# Patient Record
Sex: Female | Born: 1965 | Race: White | Hispanic: No | Smoking: Current every day smoker
Health system: Southern US, Community
[De-identification: ages and names within clinical notes are randomized; demographics above are authoritative.]

## PROBLEM LIST (undated history)

## (undated) DIAGNOSIS — I10 Essential (primary) hypertension: Secondary | ICD-10-CM

## (undated) DIAGNOSIS — F32A Depression, unspecified: Secondary | ICD-10-CM

## (undated) DIAGNOSIS — F419 Anxiety disorder, unspecified: Secondary | ICD-10-CM

## (undated) HISTORY — DX: Essential (primary) hypertension: I10

## (undated) HISTORY — DX: Depression, unspecified: F32.A

## (undated) HISTORY — PX: ABDOMINAL HYSTERECTOMY: SHX81

## (undated) HISTORY — PX: CHOLECYSTECTOMY: SHX55

## (undated) HISTORY — DX: Anxiety disorder, unspecified: F41.9

---

## 2004-09-30 ENCOUNTER — Other Ambulatory Visit: Payer: Self-pay

## 2004-09-30 ENCOUNTER — Emergency Department: Payer: Self-pay | Admitting: Emergency Medicine

## 2005-08-05 ENCOUNTER — Emergency Department: Payer: Self-pay | Admitting: Emergency Medicine

## 2007-04-29 ENCOUNTER — Emergency Department: Payer: Self-pay | Admitting: Emergency Medicine

## 2007-09-27 ENCOUNTER — Emergency Department: Payer: Self-pay | Admitting: Emergency Medicine

## 2007-11-12 ENCOUNTER — Ambulatory Visit: Payer: Self-pay | Admitting: Vascular Surgery

## 2009-12-24 ENCOUNTER — Emergency Department: Payer: Self-pay | Admitting: Emergency Medicine

## 2010-01-08 ENCOUNTER — Emergency Department: Payer: Self-pay | Admitting: Unknown Physician Specialty

## 2010-03-18 ENCOUNTER — Emergency Department: Payer: Self-pay | Admitting: Unknown Physician Specialty

## 2010-03-19 ENCOUNTER — Emergency Department: Payer: Self-pay | Admitting: Emergency Medicine

## 2010-03-30 ENCOUNTER — Emergency Department: Payer: Self-pay | Admitting: Emergency Medicine

## 2010-04-16 ENCOUNTER — Emergency Department: Payer: Self-pay | Admitting: Emergency Medicine

## 2010-04-27 ENCOUNTER — Emergency Department: Payer: Self-pay | Admitting: Emergency Medicine

## 2010-05-06 ENCOUNTER — Emergency Department: Payer: Self-pay | Admitting: Emergency Medicine

## 2010-05-26 ENCOUNTER — Emergency Department: Payer: Self-pay | Admitting: Emergency Medicine

## 2010-10-08 ENCOUNTER — Emergency Department: Payer: Self-pay | Admitting: Emergency Medicine

## 2010-11-12 ENCOUNTER — Emergency Department: Payer: Self-pay | Admitting: Emergency Medicine

## 2010-12-19 ENCOUNTER — Emergency Department: Payer: Self-pay | Admitting: Emergency Medicine

## 2011-02-18 ENCOUNTER — Emergency Department: Payer: Self-pay | Admitting: Emergency Medicine

## 2011-05-13 ENCOUNTER — Emergency Department: Payer: Self-pay | Admitting: Internal Medicine

## 2011-05-15 ENCOUNTER — Emergency Department: Payer: Self-pay | Admitting: Emergency Medicine

## 2011-07-11 ENCOUNTER — Emergency Department: Payer: Self-pay | Admitting: Emergency Medicine

## 2011-07-15 ENCOUNTER — Emergency Department: Payer: Self-pay | Admitting: Unknown Physician Specialty

## 2012-02-13 ENCOUNTER — Emergency Department: Payer: Self-pay | Admitting: Emergency Medicine

## 2012-02-13 LAB — BASIC METABOLIC PANEL
Anion Gap: 8 (ref 7–16)
BUN: 6 mg/dL — ABNORMAL LOW (ref 7–18)
Calcium, Total: 8.4 mg/dL — ABNORMAL LOW (ref 8.5–10.1)
Chloride: 111 mmol/L — ABNORMAL HIGH (ref 98–107)
Co2: 23 mmol/L (ref 21–32)
EGFR (Non-African Amer.): >60
Glucose: 105 mg/dL — ABNORMAL HIGH (ref 65–99)
Potassium: 3.7 mmol/L (ref 3.5–5.1)

## 2012-02-13 LAB — CBC
MCHC: 33.6 g/dL (ref 32.0–36.0)
Platelet: 353 10*3/uL (ref 150–440)
RBC: 4.28 10*6/uL (ref 3.80–5.20)
RDW: 15.4 % — ABNORMAL HIGH (ref 11.5–14.5)

## 2012-10-31 ENCOUNTER — Emergency Department: Payer: Self-pay | Admitting: Emergency Medicine

## 2012-12-22 ENCOUNTER — Emergency Department: Payer: Self-pay | Admitting: Unknown Physician Specialty

## 2016-01-24 ENCOUNTER — Emergency Department
Admission: EM | Admit: 2016-01-24 | Discharge: 2016-01-25 | Disposition: A | Discharge status: Home / Self Care | Payer: Self-pay | Attending: Emergency Medicine | Admitting: Emergency Medicine

## 2016-01-24 ENCOUNTER — Encounter: Payer: Self-pay | Admitting: *Deleted

## 2016-01-24 ENCOUNTER — Emergency Department: Payer: Self-pay

## 2016-01-24 DIAGNOSIS — R1013 Epigastric pain: Secondary | ICD-10-CM | POA: Insufficient documentation

## 2016-01-24 DIAGNOSIS — F17210 Nicotine dependence, cigarettes, uncomplicated: Secondary | ICD-10-CM | POA: Insufficient documentation

## 2016-01-24 LAB — COMPREHENSIVE METABOLIC PANEL
ALT: 18 U/L (ref 14–54)
AST: 18 U/L (ref 15–41)
Albumin: 4.3 g/dL (ref 3.5–5.0)
Alkaline Phosphatase: 92 U/L (ref 38–126)
Anion gap: 8 (ref 5–15)
BUN: 8 mg/dL (ref 6–20)
CHLORIDE: 103 mmol/L (ref 101–111)
CO2: 27 mmol/L (ref 22–32)
Calcium: 9.2 mg/dL (ref 8.9–10.3)
Creatinine, Ser: 0.58 mg/dL (ref 0.44–1.00)
GFR calc Af Amer: >60 mL/min (ref >60)
GFR calc non Af Amer: >60 mL/min (ref >60)
GLUCOSE: 108 mg/dL — AB (ref 65–99)
POTASSIUM: 3.7 mmol/L (ref 3.5–5.1)
SODIUM: 138 mmol/L (ref 135–145)
Total Bilirubin: 0.5 mg/dL (ref 0.3–1.2)
Total Protein: 7.6 g/dL (ref 6.5–8.1)

## 2016-01-24 LAB — URINALYSIS COMPLETE WITH MICROSCOPIC (ARMC ONLY)
BACTERIA UA: NONE SEEN
Bilirubin Urine: NEGATIVE
Glucose, UA: NEGATIVE mg/dL
KETONES UR: NEGATIVE mg/dL
Nitrite: NEGATIVE
Protein, ur: NEGATIVE mg/dL
RBC / HPF: NONE SEEN RBC/hpf (ref 0–5)
Specific Gravity, Urine: 1.008 (ref 1.005–1.030)
pH: 6 (ref 5.0–8.0)

## 2016-01-24 LAB — CBC WITH DIFFERENTIAL/PLATELET
BASOS ABS: 0.1 10*3/uL (ref 0–0.1)
BASOS PCT: 1 %
Eosinophils Absolute: 0.2 10*3/uL (ref 0–0.7)
Eosinophils Relative: 2 %
HEMATOCRIT: 35.6 % (ref 35.0–47.0)
Hemoglobin: 12.4 g/dL (ref 12.0–16.0)
Lymphocytes Relative: 22 %
Lymphs Abs: 2.1 10*3/uL (ref 1.0–3.6)
MCH: 31.2 pg (ref 26.0–34.0)
MCHC: 34.9 g/dL (ref 32.0–36.0)
MCV: 89.4 fL (ref 80.0–100.0)
MONO ABS: 0.9 10*3/uL (ref 0.2–0.9)
Monocytes Relative: 10 %
NEUTROS ABS: 6.1 10*3/uL (ref 1.4–6.5)
Neutrophils Relative %: 65 %
PLATELETS: 291 10*3/uL (ref 150–440)
RBC: 3.98 MIL/uL (ref 3.80–5.20)
RDW: 13.5 % (ref 11.5–14.5)
WBC: 9.4 10*3/uL (ref 3.6–11.0)

## 2016-01-24 LAB — LIPASE, BLOOD: LIPASE: 25 U/L (ref 11–51)

## 2016-01-24 MED ORDER — GI COCKTAIL ~~LOC~~
30.0000 mL | Freq: Once | ORAL | Status: AC
  Administered 2016-01-24: 30 mL via ORAL
  Filled 2016-01-24: qty 30

## 2016-01-24 MED ORDER — SODIUM CHLORIDE 0.9 % IV BOLUS (SEPSIS)
1000.0000 mL | Freq: Once | INTRAVENOUS | Status: AC
  Administered 2016-01-24: 1000 mL via INTRAVENOUS

## 2016-01-24 MED ORDER — FAMOTIDINE 20 MG PO TABS: 20.0000 mg | ORAL_TABLET | Freq: Two times a day (BID) | ORAL | Status: DC

## 2016-01-24 MED ORDER — FAMOTIDINE IN NACL 20-0.9 MG/50ML-% IV SOLN
20.0000 mg | Freq: Once | INTRAVENOUS | Status: AC
  Administered 2016-01-24: 20 mg via INTRAVENOUS
  Filled 2016-01-24: qty 50

## 2016-01-24 MED ORDER — BARIUM SULFATE 2.1 % PO SUSP
450.0000 mL | ORAL | Status: AC
  Administered 2016-01-24 (×2): 450 mL via ORAL

## 2016-01-24 MED ORDER — GI COCKTAIL ~~LOC~~
ORAL | Status: AC
  Filled 2016-01-24: qty 30

## 2016-01-24 MED ORDER — SUCRALFATE 1 G PO TABS: 1.0000 g | ORAL_TABLET | Freq: Four times a day (QID) | ORAL | Status: DC

## 2016-01-24 NOTE — ED Notes (Addendum)
Pt to ED with left mid quad abd pain and lower back pain. Pt states diarrhea, denies vomiting. Vitals stable, NAD noted. Pt denies any associated urinary s/s.

## 2016-01-24 NOTE — Discharge Instructions (Signed)

## 2016-01-24 NOTE — ED Notes (Signed)
Dr Alphonzo LemmingsMcShane requested that Mary Hall void and then have bladder scan performed - prior to my entering the room Mary Hall voided "large amount" in the toilet - bladder scanner reveals 61cc of urine

## 2016-01-24 NOTE — ED Provider Notes (Addendum)
[facility] Emergency Department Provider Note  ____________________________________________   I have reviewed the triage vital signs and the nursing notes.   HISTORY  Chief Complaint Abdominal Pain    HPI Mary Hall is a 50 y.o. female presents today with epigastric abdominal pain. There is no lower abdominal pain. She states that she has had no loose stools today. She's not had any vomiting but had some nausea. She denies melena bright red blood per rectum. She states that she has had pain off and on her stomach for some time. She has no chest pain or shortness of breath. The pain is sharp, and nonradiating. She has not had any dysuria or urinary frequency. Has had her gallbladder out. States that she had her gallbladder out she has 3 very particular about what she eats because it can cause her to have discomfort like this. Has had this several times in the past.     History reviewed. No pertinent past medical history.  There are no active problems to display for this patient.   Past Surgical History  Procedure Laterality Date  . Abdominal hysterectomy    . Cholecystectomy      No current outpatient prescriptions on file.  Allergies Contrast media; Morphine and related; and Motrin  History reviewed. No pertinent family history.  Social History Social History  Substance Use Topics  . Smoking status: Current Every Day Smoker -- 1.00 packs/day    Types: Cigarettes  . Smokeless tobacco: None  . Alcohol Use: No    Review of Systems Constitutional: No fever/chills Eyes: No visual changes. ENT: No sore throat. No stiff neck no neck pain Cardiovascular: Denies chest pain. Respiratory: Denies shortness of breath. Gastrointestinal:   no vomiting.  No loose stools .  No constipation. Genitourinary: Negative for dysuria. Musculoskeletal: Negative lower extremity swelling Skin: Negative for rash. Neurological: Negative for headaches, focal  weakness or numbness. 10-point ROS otherwise negative.  ____________________________________________   PHYSICAL EXAM:  VITAL SIGNS: ED Triage Vitals  Enc Vitals Group     BP 01/24/16 1920 165/93 mmHg     Pulse Rate 01/24/16 1920 109     Resp 01/24/16 1920 18     Temp 01/24/16 1920 98.5 F (36.9 C)     Temp Source 01/24/16 1920 Oral     SpO2 01/24/16 1920 99 %     Weight 01/24/16 1920 140 lb (63.504 kg)     Height 01/24/16 1920 5\' 2"  (1.575 m)     Head Cir --      Peak Flow --      Pain Score 01/24/16 1921 10     Pain Loc --      Pain Edu? --      Excl. in GC? --     Constitutional: Alert and oriented. Well appearing and in no acute distress. Eyes: Conjunctivae are normal. PERRL. EOMI. Head: Atraumatic. Nose: No congestion/rhinnorhea. Mouth/Throat: Mucous membranes are moist.  Oropharynx non-erythematous. Neck: No stridor.   Nontender with no meningismus Cardiovascular: Normal rate, regular rhythm. Grossly normal heart sounds.  Good peripheral circulation. Respiratory: Normal respiratory effort.  No retractions. Lungs CTAB. Abdominal: Soft and tender to palpation in the epigastric region. No distention. No guarding no rebound Back:  There is no focal tenderness or step off there is no midline tenderness there are no lesions noted. there is no CVA tenderness  Musculoskeletal: No lower extremity tenderness. No joint effusions, no DVT signs strong distal pulses no edema Neurologic:  Normal  speech and language. No gross focal neurologic deficits are appreciated.  Skin:  Skin is warm, dry and intact. No rash noted. Psychiatric: Mood and affect are normal. Speech and behavior are normal.  ____________________________________________   LABS (all labs ordered are listed, but only abnormal results are displayed)  Labs Reviewed  LIPASE, BLOOD  COMPREHENSIVE METABOLIC PANEL  CBC  URINALYSIS COMPLETEWITH MICROSCOPIC ([facility] ONLY)    ____________________________________________  EKG  I personally interpreted any EKGs ordered by me or triage  ____________________________________________  RADIOLOGY  I reviewed any imaging ordered by me or triage that were performed during my shift and, if possible, patient and/or family made aware of any abnormal findings. ____________________________________________   PROCEDURES  Procedure(s) performed: None  Critical Care performed: None  ____________________________________________   INITIAL IMPRESSION / ASSESSMENT AND PLAN / ED COURSE  Pertinent labs & imaging results that were available during my care of the patient were reviewed by me and considered in my medical decision making (see chart for details).  Patient with reproducible epigastric abdominal pain which is been there off and on for some time. Her abdomen is actually not surgical at this time. Did offer a GI cocktail to think that will help her symptoms, but she refused stating that she took something like that earlier today. We will give her antiacids. We'll check blood work and reassess. Do not think this represents PE ACS dissection myocarditis endocarditis pericarditis or pneumonia or pneumothorax or any other intra-thoracic pathology is very reproducible food related epigastric abdominal pain at this time. Most likely this represents gastritis or gastric ulcer disease, very low suspicion for perforation, pancreatitis is certainly possible as well.  ----------------------------------------- 10:18 PM on 01/24/2016 -----------------------------------------  Patient has a morphine allergy and cannot take narcotics she states. I am reassured by her blood work and findings. Patient does have continued epigastric abdominal pain. She refused a GI cocktail which I think would help her. The Pepcid has decreased her pain however. Patient is requesting a CT scan for further evaluation of the discomfort she is  experiencing. I have low suspicion that we'll see a perforation but I certainly can't rule it out without imaging and we will obtain a quick CT scan to make sure that there is no other acute pathology present. If that is negative, we'll discharge the patient home  ----------------------------------------- 10:19 PM on 01/24/2016 -----------------------------------------  Patient is able to take by mouth and she does agree to a GI cocktail.     ____________________________________________   FINAL CLINICAL IMPRESSION(S) / ED DIAGNOSES  Final diagnoses:  None      This chart was dictated using voice recognition software.  Despite best efforts to proofread,  errors can occur which can change meaning.     Jeanmarie Plant, MD 01/24/16 2013  Jeanmarie Plant, MD 01/24/16 3086  Jeanmarie Plant, MD 01/24/16 2220

## 2016-03-03 ENCOUNTER — Encounter: Payer: Self-pay | Admitting: Emergency Medicine

## 2016-03-03 ENCOUNTER — Emergency Department: Payer: Self-pay

## 2016-03-03 ENCOUNTER — Emergency Department
Admission: EM | Admit: 2016-03-03 | Discharge: 2016-03-03 | Disposition: A | Discharge status: Home / Self Care | Payer: Self-pay | Attending: Emergency Medicine | Admitting: Emergency Medicine

## 2016-03-03 DIAGNOSIS — K219 Gastro-esophageal reflux disease without esophagitis: Secondary | ICD-10-CM | POA: Insufficient documentation

## 2016-03-03 DIAGNOSIS — K529 Noninfective gastroenteritis and colitis, unspecified: Secondary | ICD-10-CM | POA: Insufficient documentation

## 2016-03-03 DIAGNOSIS — F17210 Nicotine dependence, cigarettes, uncomplicated: Secondary | ICD-10-CM | POA: Insufficient documentation

## 2016-03-03 LAB — CBC
HEMATOCRIT: 40.9 % (ref 35.0–47.0)
HEMOGLOBIN: 13.8 g/dL (ref 12.0–16.0)
MCH: 30.7 pg (ref 26.0–34.0)
MCHC: 33.9 g/dL (ref 32.0–36.0)
MCV: 90.6 fL (ref 80.0–100.0)
Platelets: 316 10*3/uL (ref 150–440)
RBC: 4.52 MIL/uL (ref 3.80–5.20)
RDW: 13.1 % (ref 11.5–14.5)
WBC: 10.4 10*3/uL (ref 3.6–11.0)

## 2016-03-03 LAB — COMPREHENSIVE METABOLIC PANEL
ALBUMIN: 4.5 g/dL (ref 3.5–5.0)
ALT: 20 U/L (ref 14–54)
ANION GAP: 9 (ref 5–15)
AST: 25 U/L (ref 15–41)
Alkaline Phosphatase: 96 U/L (ref 38–126)
BUN: 11 mg/dL (ref 6–20)
CO2: 24 mmol/L (ref 22–32)
Calcium: 9.3 mg/dL (ref 8.9–10.3)
Chloride: 106 mmol/L (ref 101–111)
Creatinine, Ser: 0.62 mg/dL (ref 0.44–1.00)
GFR calc Af Amer: >60 mL/min (ref >60)
GFR calc non Af Amer: >60 mL/min (ref >60)
GLUCOSE: 102 mg/dL — AB (ref 65–99)
POTASSIUM: 3.9 mmol/L (ref 3.5–5.1)
SODIUM: 139 mmol/L (ref 135–145)
Total Bilirubin: 0.4 mg/dL (ref 0.3–1.2)
Total Protein: 7.6 g/dL (ref 6.5–8.1)

## 2016-03-03 LAB — TROPONIN I: Troponin I: <0.03 ng/mL (ref <0.031)

## 2016-03-03 LAB — LIPASE, BLOOD: Lipase: 37 U/L (ref 11–51)

## 2016-03-03 MED ORDER — GI COCKTAIL ~~LOC~~
30.0000 mL | Freq: Once | ORAL | Status: AC
  Administered 2016-03-03: 30 mL via ORAL
  Filled 2016-03-03: qty 30

## 2016-03-03 MED ORDER — ONDANSETRON HCL 4 MG PO TABS: 4.0000 mg | ORAL_TABLET | Freq: Every day | ORAL | Status: DC | PRN

## 2016-03-03 MED ORDER — ONDANSETRON HCL 4 MG/2ML IJ SOLN
4.0000 mg | Freq: Once | INTRAMUSCULAR | Status: AC
  Administered 2016-03-03: 4 mg via INTRAVENOUS
  Filled 2016-03-03: qty 2

## 2016-03-03 MED ORDER — FAMOTIDINE 20 MG PO TABS: 20.0000 mg | ORAL_TABLET | Freq: Two times a day (BID) | ORAL | Status: DC

## 2016-03-03 MED ORDER — KETOROLAC TROMETHAMINE 30 MG/ML IJ SOLN
30.0000 mg | Freq: Once | INTRAMUSCULAR | Status: AC
  Administered 2016-03-03: 30 mg via INTRAVENOUS
  Filled 2016-03-03: qty 1

## 2016-03-03 MED ORDER — SODIUM CHLORIDE 0.9 % IV SOLN
Freq: Once | INTRAVENOUS | Status: AC
  Administered 2016-03-03: 20:00:00 via INTRAVENOUS

## 2016-03-03 MED ORDER — FENTANYL CITRATE (PF) 100 MCG/2ML IJ SOLN
25.0000 ug | Freq: Once | INTRAMUSCULAR | Status: AC
  Administered 2016-03-03: 25 ug via INTRAVENOUS
  Filled 2016-03-03: qty 2

## 2016-03-03 MED ORDER — FAMOTIDINE 20 MG PO TABS
20.0000 mg | ORAL_TABLET | Freq: Once | ORAL | Status: AC
  Administered 2016-03-03: 20 mg via ORAL
  Filled 2016-03-03: qty 1

## 2016-03-03 NOTE — ED Notes (Signed)
Discussed discharge instructions, prescriptions, and follow-up care with patient. No questions or concerns at this time. Pt stable at discharge.  

## 2016-03-03 NOTE — Discharge Instructions (Signed)
Gastroesophageal Reflux Disease, Adult Normally, food travels down the esophagus and stays in the stomach to be digested. However, when a person has gastroesophageal reflux disease (GERD), food and stomach acid move back up into the esophagus. When this happens, the esophagus becomes sore and inflamed. Over time, GERD can create small holes (ulcers) in the lining of the esophagus.  CAUSES This condition is caused by a problem with the muscle between the esophagus and the stomach (lower esophageal sphincter, or LES). Normally, the LES muscle closes after food passes through the esophagus to the stomach. When the LES is weakened or abnormal, it does not close properly, and that allows food and stomach acid to go back up into the esophagus. The LES can be weakened by certain dietary substances, medicines, and medical conditions, including:  Tobacco use.  Pregnancy.  Having a hiatal hernia.  Heavy alcohol use.  Certain foods and beverages, such as coffee, chocolate, onions, and peppermint. RISK FACTORS This condition is more likely to develop in:  People who have an increased body weight.  People who have connective tissue disorders.  People who use NSAID medicines. SYMPTOMS Symptoms of this condition include:  Heartburn.  Difficult or painful swallowing.  The feeling of having a lump in the throat.  Abitter taste in the mouth.  Bad breath.  Having a large amount of saliva.  Having an upset or bloated stomach.  Belching.  Chest pain.  Shortness of breath or wheezing.  Ongoing (chronic) cough or a night-time cough.  Wearing away of tooth enamel.  Weight loss. Different conditions can cause chest pain. Make sure to see your health care provider if you experience chest pain. DIAGNOSIS Your health care provider will take a medical history and perform a physical exam. To determine if you have mild or severe GERD, your health care provider may also monitor how you respond  to treatment. You may also have other tests, including:  An endoscopy toexamine your stomach and esophagus with a small camera.  A test thatmeasures the acidity level in your esophagus.  A test thatmeasures how much pressure is on your esophagus.  A barium swallow or modified barium swallow to show the shape, size, and functioning of your esophagus. TREATMENT The goal of treatment is to help relieve your symptoms and to prevent complications. Treatment for this condition may vary depending on how severe your symptoms are. Your health care provider may recommend:  Changes to your diet.  Medicine.  Surgery. HOME CARE INSTRUCTIONS Diet  Follow a diet as recommended by your health care provider. This may involve avoiding foods and drinks such as:  Coffee and tea (with or without caffeine).  Drinks that containalcohol.  Energy drinks and sports drinks.  Carbonated drinks or sodas.  Chocolate and cocoa.  Peppermint and mint flavorings.  Garlic and onions.  Horseradish.  Spicy and acidic foods, including peppers, chili powder, curry powder, vinegar, hot sauces, and barbecue sauce.  Citrus fruit juices and citrus fruits, such as oranges, lemons, and limes.  Tomato-based foods, such as red sauce, chili, salsa, and pizza with red sauce.  Fried and fatty foods, such as donuts, french fries, potato chips, and high-fat dressings.  High-fat meats, such as hot dogs and fatty cuts of red and white meats, such as rib eye steak, sausage, ham, and bacon.  High-fat dairy items, such as whole milk, butter, and cream cheese.  Eat small, frequent meals instead of large meals.  Avoid drinking large amounts of liquid with your  meals.  Avoid eating meals during the 2-3 hours before bedtime.  Avoid lying down right after you eat.  Do not exercise right after you eat. General Instructions  Pay attention to any changes in your symptoms.  Take over-the-counter and prescription  medicines only as told by your health care provider. Do not take aspirin, ibuprofen, or other NSAIDs unless your health care provider told you to do so.  Do not use any tobacco products, including cigarettes, chewing tobacco, and e-cigarettes. If you need help quitting, ask your health care provider.  Wear loose-fitting clothing. Do not wear anything tight around your waist that causes pressure on your abdomen.  Raise (elevate) the head of your bed 6 inches (15cm).  Try to reduce your stress, such as with yoga or meditation. If you need help reducing stress, ask your health care provider.  If you are overweight, reduce your weight to an amount that is healthy for you. Ask your health care provider for guidance about a safe weight loss goal.  Keep all follow-up visits as told by your health care provider. This is important. SEEK MEDICAL CARE IF:  You have new symptoms.  You have unexplained weight loss.  You have difficulty swallowing, or it hurts to swallow.  You have wheezing or a persistent cough.  Your symptoms do not improve with treatment.  You have a hoarse voice. SEEK IMMEDIATE MEDICAL CARE IF:  You have pain in your arms, neck, jaw, teeth, or back.  You feel sweaty, dizzy, or light-headed.  You have chest pain or shortness of breath.  You vomit and your vomit looks like blood or coffee grounds.  You faint.  Your stool is bloody or black.  You cannot swallow, drink, or eat.   This information is not intended to replace advice given to you by your health care provider. Make sure you discuss any questions you have with your health care provider.   Document Released: 06/27/2005 Document Revised: 06/08/2015 Document Reviewed: 01/12/2015 Elsevier Interactive Patient Education 2016 [employer] Norovirus Infection A norovirus infection is caused by exposure to a virus in a group of similar viruses (noroviruses). This type of infection causes inflammation in your  stomach and intestines (gastroenteritis). Norovirus is the most common cause of gastroenteritis. It also causes food poisoning. Anyone can get a norovirus infection. It spreads very easily (contagious). You can get it from contaminated food, water, surfaces, or other people. Norovirus is found in the stool or vomit of infected people. You can spread the infection as soon as you feel sick until 2 weeks after you recover.  Symptoms usually begin within 2 days after you become infected. Most norovirus symptoms affect the digestive system. CAUSES Norovirus infection is caused by contact with norovirus. You can catch norovirus if you:  Eat or drink something contaminated with norovirus.  Touch surfaces or objects contaminated with norovirus and then put your hand in your mouth.  Have direct contact with an infected person who has symptoms.  Share food, drink, or utensils with someone with who is sick with norovirus. SIGNS AND SYMPTOMS Symptoms of norovirus may include:  Nausea.  Vomiting.  Diarrhea.  Stomach cramps.  Fever.  Chills.  Headache.  Muscle aches.  Tiredness. DIAGNOSIS Your health care provider may suspect norovirus based on your symptoms and physical exam. Your health care provider may also test a sample of your stool or vomit for the virus.  TREATMENT There is no specific treatment for norovirus. Most people get better without  treatment in about 2 days. HOME CARE INSTRUCTIONS  Replace lost fluids by drinking plenty of water or rehydration fluids containing important minerals called electrolytes. This prevents dehydration. Drink enough fluid to keep your urine clear or pale yellow.  Do not prepare food for others while you are infected. Wait at least 3 days after recovering from the illness to do that. PREVENTION   Wash your hands often, especially after using the toilet or changing a diaper.  Wash fruits and vegetables thoroughly before preparing or serving  them.  Throw out any food that a sick person may have touched.  Disinfect contaminated surfaces immediately after someone in the household has been sick. Use a bleach-based household cleaner.  Immediately remove and wash soiled clothes or sheets. SEEK MEDICAL CARE IF:  Your vomiting, diarrhea, and stomach pain is getting worse.  Your symptoms of norovirus do not go away after 2-3 days. SEEK IMMEDIATE MEDICAL CARE IF:  You develop symptoms of dehydration that do not improve with fluid replacement. This may include:  Excessive sleepiness.  Lack of tears.  Dry mouth.  Dizziness when standing.  Weak pulse.   This information is not intended to replace advice given to you by your health care provider. Make sure you discuss any questions you have with your health care provider.   Document Released: 12/08/2002 Document Revised: 10/08/2014 Document Reviewed: 02/25/2014 Elsevier Interactive Patient Education [employer]

## 2016-03-03 NOTE — ED Notes (Signed)
L upper abdominal pain and chest tightness began at 1 pm today.

## 2016-03-03 NOTE — ED Provider Notes (Signed)
[facility] Emergency Department Provider Note        Time seen: ----------------------------------------- 7:16 PM on 03/03/2016 -----------------------------------------    I have reviewed the triage vital signs and the nursing notes.   HISTORY  Chief Complaint Abdominal Pain and Chest Pain    HPI Mary Hall is a 50 y.o. female who presents to ER for upper abdominal pain and tightness that began about 1 PM today.She has also had some diarrhea associated with this. Patient states she's not had these symptoms before, nothing is made it better or worse. She denies fevers, chills, chest pain or other complaints.   History reviewed. No pertinent past medical history.  There are no active problems to display for this patient.   Past Surgical History  Procedure Laterality Date  . Abdominal hysterectomy    . Cholecystectomy      Allergies Contrast media; Morphine and related; and Motrin  Social History Social History  Substance Use Topics  . Smoking status: Current Every Day Smoker -- 1.00 packs/day    Types: Cigarettes  . Smokeless tobacco: None  . Alcohol Use: No    Review of Systems Constitutional: Negative for fever. Eyes: Negative for visual changes. ENT: Negative for sore throat. Cardiovascular: Negative for chest pain. Respiratory: Negative for shortness of breath. Gastrointestinal: Positive for abdominal pain, nausea and diarrhea Genitourinary: Negative for dysuria. Musculoskeletal: Negative for back pain. Skin: Negative for rash. Neurological: Negative for headaches, focal weakness or numbness.  10-point ROS otherwise negative.  ____________________________________________   PHYSICAL EXAM:  VITAL SIGNS: ED Triage Vitals  Enc Vitals Group     BP 03/03/16 1857 133/69 mmHg     Pulse Rate 03/03/16 1857 80     Resp 03/03/16 1857 20     Temp 03/03/16 1857 99.1 F (37.3 C)     Temp Source 03/03/16 1857 Oral     SpO2  03/03/16 1857 100 %     Weight 03/03/16 1857 150 lb (68.04 kg)     Height 03/03/16 1857 5\' 2"  (1.575 m)     Head Cir --      Peak Flow --      Pain Score 03/03/16 1858 10     Pain Loc --      Pain Edu? --      Excl. in GC? --     Constitutional: Alert and oriented. Well appearing and in no distress. Eyes: Conjunctivae are normal. PERRL. Normal extraocular movements. ENT   Head: Normocephalic and atraumatic.   Nose: No congestion/rhinnorhea.   Mouth/Throat: Mucous membranes are moist.   Neck: No stridor. Cardiovascular: Normal rate, regular rhythm. No murmurs, rubs, or gallops. Respiratory: Normal respiratory effort without tachypnea nor retractions. Breath sounds are clear and equal bilaterally. No wheezes/rales/rhonchi. Gastrointestinal: Mild epigastric tenderness, no rebound or guarding. Normal bowel sounds. Musculoskeletal: Nontender with normal range of motion in all extremities. No lower extremity tenderness nor edema. Neurologic:  Normal speech and language. No gross focal neurologic deficits are appreciated.  Skin:  Skin is warm, dry and intact. No rash noted. Psychiatric: Mood and affect are normal. Speech and behavior are normal.  ____________________________________________  EKG: Interpreted by me. Normal sinus rhythm with a rate of 79 bpm, normal PR interval, normal QRS, normal QT interval. Normal axis.  ____________________________________________  ED COURSE:  Pertinent labs & imaging results that were available during my care of the patient were reviewed by me and considered in my medical decision making (see chart for details). Patient is  in no acute distress, will check basic abdominal labs, give IV Toradol and Zofran ____________________________________________    LABS (pertinent positives/negatives)  Labs Reviewed  COMPREHENSIVE METABOLIC PANEL - Abnormal; Notable for the following:    Glucose, Bld 102 (*)    All other components within normal  limits  LIPASE, BLOOD  CBC  TROPONIN I  URINALYSIS COMPLETEWITH MICROSCOPIC ([facility] ONLY)    RADIOLOGY Images were viewed by me  Abdomen 2 view IMPRESSION: Scattered small calcified granulomas. No edema or consolidation.  ____________________________________________  FINAL ASSESSMENT AND PLAN  Gastroenteritis, GERD  Plan: Patient with labs and imaging as dictated above. Patient does presents to ER with gastroenteritis symptoms that are exacerbating her underlying GERD or peptic ulcer disease. I will restart an acids, prescribe antiemetics and encouraged close follow-up with [facility] neurology.   Emily Filbert, MD   Note: This dictation was prepared with Dragon dictation. Any transcriptional errors that result from this process are unintentional   Emily Filbert, MD 03/03/16 2040

## 2017-03-04 ENCOUNTER — Encounter: Payer: Self-pay | Admitting: Emergency Medicine

## 2017-03-04 ENCOUNTER — Emergency Department
Admission: EM | Admit: 2017-03-04 | Discharge: 2017-03-04 | Disposition: A | Discharge status: Home / Self Care | Payer: Self-pay | Attending: Dermatology | Admitting: Dermatology

## 2017-03-04 DIAGNOSIS — Z5321 Procedure and treatment not carried out due to patient leaving prior to being seen by health care provider: Secondary | ICD-10-CM | POA: Insufficient documentation

## 2017-03-04 DIAGNOSIS — K5900 Constipation, unspecified: Secondary | ICD-10-CM | POA: Insufficient documentation

## 2017-03-04 DIAGNOSIS — R1030 Lower abdominal pain, unspecified: Secondary | ICD-10-CM | POA: Insufficient documentation

## 2017-03-04 DIAGNOSIS — F17210 Nicotine dependence, cigarettes, uncomplicated: Secondary | ICD-10-CM | POA: Insufficient documentation

## 2017-03-04 LAB — CBC
HEMATOCRIT: 43.2 % (ref 35.0–47.0)
HEMOGLOBIN: 15.2 g/dL (ref 12.0–16.0)
MCH: 30.4 pg (ref 26.0–34.0)
MCHC: 35.2 g/dL (ref 32.0–36.0)
MCV: 86.4 fL (ref 80.0–100.0)
Platelets: 373 10*3/uL (ref 150–440)
RBC: 5 MIL/uL (ref 3.80–5.20)
RDW: 13.5 % (ref 11.5–14.5)
WBC: 11.8 10*3/uL — ABNORMAL HIGH (ref 3.6–11.0)

## 2017-03-04 LAB — COMPREHENSIVE METABOLIC PANEL
ALK PHOS: 125 U/L (ref 38–126)
ALT: 14 U/L (ref 14–54)
ANION GAP: 10 (ref 5–15)
AST: 20 U/L (ref 15–41)
Albumin: 4.2 g/dL (ref 3.5–5.0)
BUN: 9 mg/dL (ref 6–20)
CALCIUM: 9.1 mg/dL (ref 8.9–10.3)
CO2: 24 mmol/L (ref 22–32)
Chloride: 103 mmol/L (ref 101–111)
Creatinine, Ser: 0.46 mg/dL (ref 0.44–1.00)
GFR calc Af Amer: >60 mL/min (ref >60)
GFR calc non Af Amer: >60 mL/min (ref >60)
GLUCOSE: 116 mg/dL — AB (ref 65–99)
POTASSIUM: 3.7 mmol/L (ref 3.5–5.1)
Sodium: 137 mmol/L (ref 135–145)
Total Bilirubin: 0.6 mg/dL (ref 0.3–1.2)
Total Protein: 8.4 g/dL — ABNORMAL HIGH (ref 6.5–8.1)

## 2017-03-04 LAB — LIPASE, BLOOD: Lipase: 22 U/L (ref 11–51)

## 2017-03-04 NOTE — ED Triage Notes (Signed)
C/O lower abdominal pain x  2 days.  Yesterday took two doses of miralax and today took a fleet enema.  No results.  Last BM-- Patient unsure.

## 2017-09-14 ENCOUNTER — Other Ambulatory Visit: Payer: Self-pay

## 2017-09-14 ENCOUNTER — Emergency Department
Admission: EM | Admit: 2017-09-14 | Discharge: 2017-09-15 | Disposition: A | Discharge status: Home / Self Care | Payer: Self-pay | Attending: Emergency Medicine | Admitting: Emergency Medicine

## 2017-09-14 DIAGNOSIS — F17210 Nicotine dependence, cigarettes, uncomplicated: Secondary | ICD-10-CM | POA: Insufficient documentation

## 2017-09-14 DIAGNOSIS — R51 Headache: Secondary | ICD-10-CM | POA: Insufficient documentation

## 2017-09-14 DIAGNOSIS — R519 Headache, unspecified: Secondary | ICD-10-CM

## 2017-09-14 DIAGNOSIS — Z79899 Other long term (current) drug therapy: Secondary | ICD-10-CM | POA: Insufficient documentation

## 2017-09-14 NOTE — ED Triage Notes (Signed)
Reports having headache that started yesterday, also reports chest congestion.

## 2017-09-15 ENCOUNTER — Emergency Department: Payer: Self-pay

## 2017-09-15 LAB — CBC
HCT: 40.2 % (ref 35.0–47.0)
Hemoglobin: 13.9 g/dL (ref 12.0–16.0)
MCH: 30.3 pg (ref 26.0–34.0)
MCHC: 34.7 g/dL (ref 32.0–36.0)
MCV: 87.5 fL (ref 80.0–100.0)
Platelets: 324 10*3/uL (ref 150–440)
RBC: 4.6 MIL/uL (ref 3.80–5.20)
RDW: 13.4 % (ref 11.5–14.5)
WBC: 8.4 10*3/uL (ref 3.6–11.0)

## 2017-09-15 LAB — BASIC METABOLIC PANEL
Anion gap: 9 (ref 5–15)
BUN: 8 mg/dL (ref 6–20)
CHLORIDE: 104 mmol/L (ref 101–111)
CO2: 25 mmol/L (ref 22–32)
CREATININE: 0.56 mg/dL (ref 0.44–1.00)
Calcium: 9 mg/dL (ref 8.9–10.3)
GFR calc non Af Amer: >60 mL/min (ref >60)
GLUCOSE: 98 mg/dL (ref 65–99)
Potassium: 3.9 mmol/L (ref 3.5–5.1)
Sodium: 138 mmol/L (ref 135–145)

## 2017-09-15 MED ORDER — SODIUM CHLORIDE 0.9 % IV BOLUS (SEPSIS)
1000.0000 mL | Freq: Once | INTRAVENOUS | Status: AC
  Administered 2017-09-15: 1000 mL via INTRAVENOUS

## 2017-09-15 MED ORDER — LORAZEPAM 0.5 MG PO TABS: 0.5000 mg | ORAL_TABLET | Freq: Every evening | ORAL | 0 refills | Status: AC | PRN

## 2017-09-15 MED ORDER — LORAZEPAM 0.5 MG PO TABS
0.5000 mg | ORAL_TABLET | Freq: Once | ORAL | Status: AC
  Administered 2017-09-15: 0.5 mg via ORAL
  Filled 2017-09-15: qty 1

## 2017-09-15 MED ORDER — PROMETHAZINE HCL 25 MG/ML IJ SOLN
12.5000 mg | Freq: Once | INTRAMUSCULAR | Status: AC
  Administered 2017-09-15: 12.5 mg via INTRAMUSCULAR
  Filled 2017-09-15: qty 1

## 2017-09-15 MED ORDER — ONDANSETRON HCL 4 MG/2ML IJ SOLN
4.0000 mg | Freq: Once | INTRAMUSCULAR | Status: AC
  Administered 2017-09-15: 4 mg via INTRAVENOUS
  Filled 2017-09-15: qty 2

## 2017-09-15 NOTE — ED Provider Notes (Signed)
[facility] Emergency Department Provider Note   ____________________________________________   First MD Initiated Contact with Patient 09/14/17 2359     (approximate)  I have reviewed the triage vital signs and the nursing notes.   HISTORY  Chief Complaint Headache    HPI Suzzette RighterKimberly Gerke is a 51 y.o. female for evaluation of a headache.  Patient reports that she was recently started on Risperdal, she has begun noticing since starting the medication that it seems to be causing her to have headaches.  She takes in the evening and awakens with throbbing headaches for the last several days.  She denies nausea or vomiting.  No fevers or chills.  No neck stiffness.  No numbness or weakness, but reports that she is having consistent throbbing headaches in the mornings after beginning her Risperdal.  Reports her headaches have been gradual in onset over the last few days.  Not associated any weakness or trouble walking.  No chest pain or trouble breathing.  Her throbbing sensation along the forehead on both sides without any visual changes.  She has had a slight amount of nasal congestion as well.  She has had headaches off and on throughout her lifetime, reports that this was not maximal at onset, did not feel abrupt.  Seems to be getting worse in the mornings for the last couple of days and tends to go away during the day but she suspects it may be related to taking Risperdal which she recently started.  No past medical history on file.  There are no active problems to display for this patient.   Past Surgical History:  Procedure Laterality Date  . ABDOMINAL HYSTERECTOMY    . CHOLECYSTECTOMY      Prior to Admission medications   Medication Sig Start Date End Date Taking? Authorizing Provider  hydrOXYzine (ATARAX/VISTARIL) 50 MG tablet Take 50 mg by mouth at bedtime.   Yes [provider]  famotidine (PEPCID) 20 MG tablet Take 1 tablet (20 mg  total) by mouth 2 (two) times daily. 01/24/16 01/23/17  Jeanmarie PlantMcShane, James A, MD  famotidine (PEPCID) 20 MG tablet Take 1 tablet (20 mg total) by mouth 2 (two) times daily. 03/03/16   Emily FilbertWilliams, Jonathan E, MD  LORazepam (ATIVAN) 0.5 MG tablet Take 1 tablet (0.5 mg total) by mouth at bedtime as needed for up to 3 days for sleep. 09/15/17 09/18/17  Sharyn CreamerQuale, Jaice Lague, MD  ondansetron (ZOFRAN) 4 MG tablet Take 1 tablet (4 mg total) by mouth daily as needed for nausea or vomiting. 03/03/16   Emily FilbertWilliams, Jonathan E, MD  sucralfate (CARAFATE) 1 g tablet Take 1 tablet (1 g total) by mouth 4 (four) times daily. 01/24/16 01/23/17  Jeanmarie PlantMcShane, James A, MD  Risperdal  Allergies Contrast media [iodinated diagnostic agents]; Morphine and related; and Motrin [ibuprofen]  No family history on file.  Social History Social History   Tobacco Use  . Smoking status: Current Every Day Smoker    Packs/day: 1.00    Types: Cigarettes  . Smokeless tobacco: Never Used  Substance Use Topics  . Alcohol use: No  . Drug use: Not on file    Review of Systems Constitutional: No fever/chills Eyes: No visual changes. ENT: No sore throat. Cardiovascular: Denies chest pain. Respiratory: Denies shortness of breath. Gastrointestinal: No abdominal pain.  No nausea, no vomiting.  No diarrhea.  No constipation. Genitourinary: Negative for dysuria. Musculoskeletal: Negative for back pain. Skin: Negative for rash. Neurological: Negative for focal weakness or numbness.  ____________________________________________   PHYSICAL EXAM:  VITAL SIGNS: ED Triage Vitals  Enc Vitals Group     BP 09/14/17 2307 (!) 157/82     Pulse Rate 09/14/17 2307 82     Resp 09/14/17 2307 20     Temp 09/14/17 2307 98.1 F (36.7 C)     Temp Source 09/14/17 2307 Oral     SpO2 09/14/17 2307 98 %     Weight 09/14/17 2305 185 lb (83.9 kg)     Height 09/14/17 2305 5\' 2"  (1.575 m)     Head Circumference --      Peak Flow --      Pain Score 09/14/17 2304  10     Pain Loc --      Pain Edu? --      Excl. in GC? --     Constitutional: Alert and oriented. Well appearing and in no acute distress. Eyes: Conjunctivae are normal. Head: Atraumatic. Nose: Slight nasal congestion. Mouth/Throat: Mucous membranes are moist. Neck: No stridor.   Cardiovascular: Normal rate, regular rhythm. Grossly normal heart sounds.  Good peripheral circulation. Respiratory: Normal respiratory effort.  No retractions. Lungs CTAB. Gastrointestinal: Soft and nontender. No distention. Musculoskeletal: No lower extremity tenderness nor edema. Neurologic:  Normal speech and language. No gross focal neurologic deficits are appreciated.  No tremors.  No pronator drift.  Normal cranial nerve exam.  Denies any numbness or weakness in any extremity and has 5 out of 5 strength in all extremities.  No ataxia.  Extraocular movements are normal.  Denies any visual changes.  Skin:  Skin is warm, dry and intact. No rash noted. Psychiatric: Mood and affect are normal. Speech and behavior are normal.  ____________________________________________   LABS (all labs ordered are listed, but only abnormal results are displayed)  Labs Reviewed  BASIC METABOLIC PANEL  CBC   ____________________________________________  EKG   ____________________________________________  RADIOLOGY  Ct Head Wo Contrast  Result Date: 09/15/2017 CLINICAL DATA:  Headache. EXAM: CT HEAD WITHOUT CONTRAST TECHNIQUE: Contiguous axial images were obtained from the base of the skull through the vertex without intravenous contrast. COMPARISON:  None. FINDINGS: Brain: No intracranial hemorrhage, mass effect, or midline shift. No hydrocephalus. The basilar cisterns are patent. No evidence of territorial infarct or acute ischemia. No extra-axial or intracranial fluid collection. Vascular: No hyperdense vessel. Skull: No fracture or focal lesion. Sinuses/Orbits: Paranasal sinuses and mastoid air cells are clear.  The visualized orbits are unremarkable. Other: None. IMPRESSION: No acute intracranial abnormality or explanation for headache. Electronically Signed   By: Rubye Oaks M.D.   On: 09/15/2017 01:07    CT of the head reviewed, no acute ____________________________________________   PROCEDURES  Procedure(s) performed: None  Procedures  Critical Care performed: No  ____________________________________________   INITIAL IMPRESSION / ASSESSMENT AND PLAN / ED COURSE  Pertinent labs & imaging results that were available during my care of the patient were reviewed by me and considered in my medical decision making (see chart for details).  Patient returns for evaluation of throbbing bifrontal headache.  Not associated with any red flags other than she seems to be noticing it more in the mornings.  She relates that it seems to be connected to recently starting Risperdal.  Her neurologic exam is completely intact, her vital signs appear stable, afebrile no meningismus no evidence of infectious etiology or meningitis.  She has no associated neurologic deficits, and reports it was not maximal in onset, or abrupt, given her very reassuring examination  including negative head CT I would doubt this would be related to anything such as a ruptured aneurysm, especially given that seems to be a recurring headache bifrontal in nature occurring in the mornings.  No associated cardiac or pulmonary symptoms.  Did review medications, and headache is associated with Risperdal and many adults as an adverse side effect.  She reports she is not had this headache until starting it, and it seems to be associated with its use.  Discussed with patient, we treated her for pain, hydration, she reports her headache improved thereafter but still having a mild bifrontal discomfort.  Discussed with her, and recommend close return precautions, advised her to call her primary prescriber of the Risperdal on Monday which she  reports she will, and advised her to discontinue its use at this time that she has had the opportunity to review this with her primary prescriber.  Patient is agreeable and also agreeable to careful return precautions.      Patient husband driving her home   ____________________________________________   FINAL CLINICAL IMPRESSION(S) / ED DIAGNOSES  Final diagnoses:  Frontal headache      NEW MEDICATIONS STARTED DURING THIS VISIT:  This SmartLink is deprecated. Use AVSMEDLIST instead to display the medication list for a patient.   Note:  This document was prepared using Dragon voice recognition software and may include unintentional dictation errors.     Sharyn CreamerQuale, Ayano Douthitt, MD 09/15/17 [phone]

## 2017-09-15 NOTE — ED Notes (Signed)
Pt to CT

## 2017-09-15 NOTE — ED Notes (Signed)
Pt returned from CT °

## 2017-09-15 NOTE — ED Notes (Signed)
Pt reports she was started on risperdal several weeks ago [facility]. Pt reports she has been having frequent headaches since. Pt stopped taking the medication 2 days ago thinking the headache could be a side effect of the medication. Pt states she was put on the medication for "hearing voices". Pt reports she is not hearing voices right now and denies SI/HI. Pt reports tonight her headache has been ongoing for about 2 days and is from her nose up.

## 2017-09-15 NOTE — Discharge Instructions (Signed)
You have been seen in the Emergency Department (ED) for a headache.  Please use Tylenol or Motrin as needed for symptoms, but only as written on the box.  As we have discussed, please follow up with your primary care doctor as soon as possible regarding today?s Emergency Department (ED) visit and your headache symptoms.   Please stop your risperdal (which you recently started), and call your doctor Monday.  Call your doctor or return to the ED if you have a worsening headache, sudden and severe headache, confusion, slurred speech, facial droop, weakness or numbness in any arm or leg, extreme fatigue, vision problems, or other symptoms that concern you.

## 2017-09-16 ENCOUNTER — Emergency Department
Admission: EM | Admit: 2017-09-16 | Discharge: 2017-09-16 | Disposition: A | Discharge status: Home / Self Care | Payer: Self-pay | Attending: Emergency Medicine | Admitting: Emergency Medicine

## 2017-09-16 ENCOUNTER — Encounter: Payer: Self-pay | Admitting: Intensive Care

## 2017-09-16 DIAGNOSIS — F1411 Cocaine abuse, in remission: Secondary | ICD-10-CM | POA: Insufficient documentation

## 2017-09-16 DIAGNOSIS — F1510 Other stimulant abuse, uncomplicated: Secondary | ICD-10-CM

## 2017-09-16 DIAGNOSIS — F17210 Nicotine dependence, cigarettes, uncomplicated: Secondary | ICD-10-CM | POA: Insufficient documentation

## 2017-09-16 DIAGNOSIS — F1410 Cocaine abuse, uncomplicated: Secondary | ICD-10-CM

## 2017-09-16 DIAGNOSIS — F29 Unspecified psychosis not due to a substance or known physiological condition: Secondary | ICD-10-CM

## 2017-09-16 DIAGNOSIS — Z79899 Other long term (current) drug therapy: Secondary | ICD-10-CM | POA: Insufficient documentation

## 2017-09-16 DIAGNOSIS — R440 Auditory hallucinations: Secondary | ICD-10-CM | POA: Insufficient documentation

## 2017-09-16 HISTORY — DX: Cocaine abuse, uncomplicated: F14.10

## 2017-09-16 HISTORY — DX: Unspecified psychosis not due to a substance or known physiological condition: F29

## 2017-09-16 HISTORY — DX: Other stimulant abuse, uncomplicated: F15.10

## 2017-09-16 LAB — COMPREHENSIVE METABOLIC PANEL
ALT: 18 U/L (ref 14–54)
ANION GAP: 11 (ref 5–15)
AST: 20 U/L (ref 15–41)
Albumin: 4.5 g/dL (ref 3.5–5.0)
Alkaline Phosphatase: 114 U/L (ref 38–126)
BUN: 8 mg/dL (ref 6–20)
CALCIUM: 9.3 mg/dL (ref 8.9–10.3)
CHLORIDE: 105 mmol/L (ref 101–111)
CO2: 23 mmol/L (ref 22–32)
CREATININE: 0.67 mg/dL (ref 0.44–1.00)
Glucose, Bld: 114 mg/dL — ABNORMAL HIGH (ref 65–99)
Potassium: 3.7 mmol/L (ref 3.5–5.1)
Sodium: 139 mmol/L (ref 135–145)
Total Bilirubin: 0.5 mg/dL (ref 0.3–1.2)
Total Protein: 8 g/dL (ref 6.5–8.1)

## 2017-09-16 LAB — CBC
HEMATOCRIT: 44 % (ref 35.0–47.0)
Hemoglobin: 14.9 g/dL (ref 12.0–16.0)
MCH: 30.2 pg (ref 26.0–34.0)
MCHC: 33.9 g/dL (ref 32.0–36.0)
MCV: 89.1 fL (ref 80.0–100.0)
PLATELETS: 360 10*3/uL (ref 150–440)
RBC: 4.94 MIL/uL (ref 3.80–5.20)
RDW: 13.5 % (ref 11.5–14.5)
WBC: 12 10*3/uL — AB (ref 3.6–11.0)

## 2017-09-16 LAB — URINE DRUG SCREEN, QUALITATIVE (ARMC ONLY)
AMPHETAMINES, UR SCREEN: NOT DETECTED
BENZODIAZEPINE, UR SCRN: NOT DETECTED
Barbiturates, Ur Screen: NOT DETECTED
Cannabinoid 50 Ng, Ur ~~LOC~~: POSITIVE — AB
Cocaine Metabolite,Ur ~~LOC~~: POSITIVE — AB
MDMA (Ecstasy)Ur Screen: NOT DETECTED
METHADONE SCREEN, URINE: NOT DETECTED
Opiate, Ur Screen: NOT DETECTED
Phencyclidine (PCP) Ur S: NOT DETECTED
Tricyclic, Ur Screen: NOT DETECTED

## 2017-09-16 LAB — ETHANOL

## 2017-09-16 MED ORDER — OLANZAPINE 10 MG PO TABS: 10.0000 mg | ORAL_TABLET | Freq: Every day | ORAL | 1 refills | Status: DC

## 2017-09-16 NOTE — ED Provider Notes (Signed)
[facility] Emergency Department Provider Note ____________________________________________   First MD Initiated Contact with Patient 09/16/17 1926     (approximate)  I have reviewed the triage vital signs and the nursing notes.   HISTORY  Chief Complaint No chief complaint on file. Auditory hallucinations   HPI Mary Hall is a 51 y.o. female with past medical history as noted below who presents with increased paranoia and auditory hallucinations, gradual onset over the last several days, characterized by hearing voices and by paranoid thoughts, and not associated with suicidal or homicidal ideation.  History reviewed. No pertinent past medical history.  There are no active problems to display for this patient.   Past Surgical History:  Procedure Laterality Date  . ABDOMINAL HYSTERECTOMY    . CHOLECYSTECTOMY      Prior to Admission medications   Medication Sig Start Date End Date Taking? Authorizing Provider  famotidine (PEPCID) 20 MG tablet Take 1 tablet (20 mg total) by mouth 2 (two) times daily. 01/24/16 01/23/17  Jeanmarie PlantMcShane, James A, MD  famotidine (PEPCID) 20 MG tablet Take 1 tablet (20 mg total) by mouth 2 (two) times daily. 03/03/16   Emily FilbertWilliams, Jonathan E, MD  hydrOXYzine (ATARAX/VISTARIL) 50 MG tablet Take 50 mg by mouth at bedtime.    [provider]  LORazepam (ATIVAN) 0.5 MG tablet Take 1 tablet (0.5 mg total) by mouth at bedtime as needed for up to 3 days for sleep. 09/15/17 09/18/17  Sharyn CreamerQuale, Mark, MD  OLANZapine (ZYPREXA) 10 MG tablet Take 1 tablet (10 mg total) by mouth at bedtime. 09/16/17 11/15/17  Clapacs, Jackquline DenmarkJohn T, MD  ondansetron (ZOFRAN) 4 MG tablet Take 1 tablet (4 mg total) by mouth daily as needed for nausea or vomiting. 03/03/16   Emily FilbertWilliams, Jonathan E, MD  sucralfate (CARAFATE) 1 g tablet Take 1 tablet (1 g total) by mouth 4 (four) times daily. 01/24/16 01/23/17  Jeanmarie PlantMcShane, James A, MD    Allergies Contrast media [iodinated  diagnostic agents]; Morphine and related; and Motrin [ibuprofen]  History reviewed. No pertinent family history.  Social History Social History   Tobacco Use  . Smoking status: Current Every Day Smoker    Packs/day: 1.00    Types: Cigarettes  . Smokeless tobacco: Never Used  Substance Use Topics  . Alcohol use: No  . Drug use: No    Comment: HX meth    Review of Systems  Constitutional: No fever. Eyes: No visual changes. ENT: No sore throat. Cardiovascular: Denies chest pain. Respiratory: Denies shortness of breath. Gastrointestinal: No nausea, no vomiting.   Genitourinary: Negative for dysuria.  Musculoskeletal: Negative for back pain. Skin: Negative for rash. Neurological: Negative for headache.   ____________________________________________   PHYSICAL EXAM:  VITAL SIGNS: ED Triage Vitals  Enc Vitals Group     BP 09/16/17 1738 112/60     Pulse Rate 09/16/17 1738 93     Resp 09/16/17 1738 16     Temp 09/16/17 1738 98.3 F (36.8 C)     Temp Source 09/16/17 1738 Oral     SpO2 09/16/17 1738 97 %     Weight 09/16/17 1738 190 lb (86.2 kg)     Height 09/16/17 1738 5\' 2"  (1.575 m)     Head Circumference --      Peak Flow --      Pain Score 09/16/17 1924 0     Pain Loc --      Pain Edu? --      Excl. in GC? --  Constitutional: Alert and oriented. Well appearing and in no acute distress. Eyes: Conjunctivae are normal.  Head: Atraumatic. Nose: No congestion/rhinnorhea. Mouth/Throat: Mucous membranes are moist.   Neck: Normal range of motion.  Cardiovascular:  Good peripheral circulation. Respiratory: Normal respiratory effort.   Gastrointestinal: No distention.  Genitourinary: No CVA tenderness. Musculoskeletal:  Extremities warm and well perfused.  Neurologic:  Normal speech and language. No gross focal neurologic deficits are appreciated.  Skin:  Skin is warm and dry. No rash noted. Psychiatric: Mood and affect are normal. Speech and behavior are  normal.  ____________________________________________   LABS (all labs ordered are listed, but only abnormal results are displayed)  Labs Reviewed  COMPREHENSIVE METABOLIC PANEL - Abnormal; Notable for the following components:      Result Value   Glucose, Bld 114 (*)    All other components within normal limits  CBC - Abnormal; Notable for the following components:   WBC 12.0 (*)    All other components within normal limits  URINE DRUG SCREEN, QUALITATIVE ([facility] ONLY) - Abnormal; Notable for the following components:   Cocaine Metabolite,Ur [place] POSITIVE (*)    Cannabinoid 50 Ng, Ur [place] POSITIVE (*)    All other components within normal limits  ETHANOL  POC URINE PREG, ED   ____________________________________________  EKG   ____________________________________________  RADIOLOGY    ____________________________________________   PROCEDURES  Procedure(s) performed: No    Critical Care performed: No ____________________________________________   INITIAL IMPRESSION / ASSESSMENT AND PLAN / ED COURSE  Pertinent labs & imaging results that were available during my care of the patient were reviewed by me and considered in my medical decision making (see chart for details).  51 year old female with past medical history as noted above presents with auditory hallucinations and paranoia.  She denies any medical complaints.  Past medical records reviewed in Epic.  Patient was last seen in the emergency department 2 days ago for headache and discharged.  Otherwise noncontributory.   On exam, vital signs are normal, the patient is comfortable appearing and the physical exam is unremarkable.  Her screening labs are unremarkable except for the presence of cocaine and cannabinoids on her urine toxicology screen.  Patient has been evaluated in the emergency department by the psychiatrist on-call Dr. Toni Amendlapacs, who has determined that the patient is safe to be discharged home from  psychiatric perspective.  She is medically cleared.  He gave her a prescription for additional medication.  She will follow-up in [facility].  Patient feels comfortable with discharge home.  Return precautions given, the patient expresses understanding.      ____________________________________________   FINAL CLINICAL IMPRESSION(S) / ED DIAGNOSES  Final diagnoses:  Auditory hallucinations      NEW MEDICATIONS STARTED DURING THIS VISIT:  This SmartLink is deprecated. Use AVSMEDLIST instead to display the medication list for a patient.   Note:  This document was prepared using Dragon voice recognition software and may include unintentional dictation errors.    Dionne BucySiadecki, Maxen Rowland, MD 09/16/17 [phone]

## 2017-09-16 NOTE — ED Triage Notes (Signed)
Patient reports "I started seeing a psychiatrist recently and was put on risperidone but I quit taking it. And I feel paranoid where everyone is talking about me wherever I go,Im hearing voices, I have these feelings my husband is messing around on me and going to leave me" Denies HI/SI

## 2017-09-16 NOTE — Discharge Instructions (Signed)
Take the medication as prescribed, and [facility] or with your regular psychiatrist if you have one.  Return to the emergency department for new or worsening symptoms worsening voices or other hallucinations, worsening paranoia or anxiety, any thoughts of wanting to hurt yourself or anyone else, or any other new or worsening symptoms that concern you.

## 2017-09-16 NOTE — ED Notes (Signed)
Pt dressed out into appropriate behavioral health clothing. Pt belongings consist of a cell phone, a pair of pink boots, black pants, pink shirt, blue jacket, a tan bra, pink panties and pink socks. Pts husband took all of pt belongings with him.

## 2017-09-16 NOTE — ED Notes (Signed)
Pt's given back belongings.

## 2017-09-16 NOTE — Consult Note (Signed)
[place] [facility] Consult   Reason for Consult: Consult for 51 year old woman who came voluntarily to the emergency room complaining of auditory hallucinations Referring Physician: Siadecki Patient Identification: Mary Hall MRN:  [id number] Principal Diagnosis: Psychosis [facility]) Diagnosis:   Patient Active Problem List   Diagnosis Date Noted  . Psychosis ([place]) [F29] 09/16/2017  . 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 Cocaine abuse ([place]) [F14.10] 09/16/2017    Total Time spent with patient: 1 hour  Subjective:   Mary Hall is a 51 y.o. female patient admitted with "I am tired of these voices".  HPI: Patient interviewed chart reviewed.  51 year old woman comes voluntarily to the emergency room complaining of auditory hallucinations.  She says she has been having auditory hallucinations that have been almost constant since 2015.  She describes them as sounding like people talking on the other side of the wall.  Sometimes she can understand what they say sometimes not.  Despite how disturbing they have been she never saw a doctor about them until about a month ago.  Additionally she complains of chronic anxiety and paranoia.  When she is around other people she feels like they are talking about her.  Her mood stays nervous and down much of the time.  She has very little activity and stays in bed almost all the time.  She denies having any suicidal thoughts or any homicidal thoughts.  Patient did go to see a psychiatrist at [facility] about a month ago.  She says that that doctor prescribed Risperdal for her.  She took it for about 4 days and says that it gave her a severe headache and so she stopped it.  Patient denies that she is drinking or using any drugs at all recently.  She says she used to have a methamphetamine habit until a few years ago.  Social history: Patient is not working.  Lives with her husband who is also disabled.  Substance abuse history: She says she and  her husband abused methamphetamine for years but she quit 2 years ago when he had a heart attack.  Patient denies using any drugs at all recently.  Her drug screen however is positive for cocaine.  Medical history: Denies any known significant medical problems of her own.  Not on any medications currently  Past Psychiatric History: Never seen a psychiatrist before never been in any kind of mental hospital the only medicine she is ever been prescribed was the wrist but all.  No history of suicide attempts or violence.  Says that she stopped using amphetamine all on her own.  Risk to Self: Is patient at risk for suicide?: No Risk to Others:   Prior Inpatient Therapy:   Prior Outpatient Therapy:    Past Medical History: History reviewed. No pertinent past medical history.  Past Surgical History:  Procedure Laterality Date  . ABDOMINAL HYSTERECTOMY    . CHOLECYSTECTOMY     Family History: History reviewed. No pertinent family history. Family Psychiatric  History: Denies any Social History:  Social History   Substance and Sexual Activity  Alcohol Use No     Social History   Substance and Sexual Activity  Drug Use No   Comment: HX meth    Social History   Socioeconomic History  . Marital status: Married    Spouse name: None  . Number of children: None  . Years of education: None  . Highest education level: None  Social Needs  . Financial resource strain: None  .  Food insecurity - worry: None  . Food insecurity - inability: None  . Transportation needs - medical: None  . Transportation needs - non-medical: None  Occupational History  . None  Tobacco Use  . Smoking status: Current Every Day Smoker    Packs/day: 1.00    Types: Cigarettes  . Smokeless tobacco: Never Used  Substance and Sexual Activity  . Alcohol use: No  . Drug use: No    Comment: HX meth  . Sexual activity: None  Other Topics Concern  . None  Social History Narrative  . None   Additional Social  History:    Allergies:   Allergies  Allergen Reactions  . Contrast Media [Iodinated Diagnostic Agents] Other (See Comments)    High heart rate  . Morphine And Related Nausea And Vomiting  . Motrin [Ibuprofen] Nausea And Vomiting    Labs:  Results for orders placed or performed during the hospital encounter of 09/16/17 (from the past 48 hour(s))  Comprehensive metabolic panel     Status: Abnormal   Collection Time: 09/16/17  5:36 PM  Result Value Ref Range   Sodium 139 135 - 145 mmol/L   Potassium 3.7 3.5 - 5.1 mmol/L   Chloride 105 101 - 111 mmol/L   CO2 23 22 - 32 mmol/L   Glucose, Bld 114 (H) 65 - 99 mg/dL   BUN 8 6 - 20 mg/dL   Creatinine, Ser 0.67 0.44 - 1.00 mg/dL   Calcium 9.3 8.9 - 10.3 mg/dL   Total Protein 8.0 6.5 - 8.1 g/dL   Albumin 4.5 3.5 - 5.0 g/dL   AST 20 15 - 41 U/L   ALT 18 14 - 54 U/L   Alkaline Phosphatase 114 38 - 126 U/L   Total Bilirubin 0.5 0.3 - 1.2 mg/dL   GFR calc non Af Amer >60 >60 mL/min   GFR calc Af Amer >60 >60 mL/min    Comment: (NOTE) The eGFR has been calculated using the CKD EPI equation. This calculation has not been validated in all clinical situations. eGFR's persistently <60 mL/min signify possible Chronic Kidney Disease.    Anion gap 11 5 - 15  Ethanol     Status: None   Collection Time: 09/16/17  5:36 PM  Result Value Ref Range   Alcohol, Ethyl (B) <10 <10 mg/dL    Comment:        LOWEST DETECTABLE LIMIT FOR SERUM ALCOHOL IS 10 mg/dL FOR MEDICAL PURPOSES ONLY   cbc     Status: Abnormal   Collection Time: 09/16/17  5:36 PM  Result Value Ref Range   WBC 12.0 (H) 3.6 - 11.0 K/uL   RBC 4.94 3.80 - 5.20 MIL/uL   Hemoglobin 14.9 12.0 - 16.0 g/dL   HCT 44.0 35.0 - 47.0 %   MCV 89.1 80.0 - 100.0 fL   MCH 30.2 26.0 - 34.0 pg   MCHC 33.9 32.0 - 36.0 g/dL   RDW 13.5 11.5 - 14.5 %   Platelets 360 150 - 440 K/uL  Urine Drug Screen, Qualitative     Status: Abnormal   Collection Time: 09/16/17  5:36 PM  Result Value Ref Range    Tricyclic, Ur Screen NONE DETECTED NONE DETECTED   Amphetamines, Ur Screen NONE DETECTED NONE DETECTED   MDMA (Ecstasy)Ur Screen NONE DETECTED NONE DETECTED   Cocaine Metabolite,Ur [place] POSITIVE (A) NONE DETECTED   Opiate, Ur Screen NONE DETECTED NONE DETECTED   Phencyclidine (PCP) Ur S NONE DETECTED NONE DETECTED  Cannabinoid 50 Ng, Ur [place] POSITIVE (A) NONE DETECTED   Barbiturates, Ur Screen NONE DETECTED NONE DETECTED   Benzodiazepine, Ur Scrn NONE DETECTED NONE DETECTED   Methadone Scn, Ur NONE DETECTED NONE DETECTED    Comment: (NOTE) 209  Tricyclics, urine               Cutoff 1000 ng/mL 200  Amphetamines, urine             Cutoff 1000 ng/mL 300  MDMA (Ecstasy), urine           Cutoff 500 ng/mL 400  Cocaine Metabolite, urine       Cutoff 300 ng/mL 500  Opiate, urine                   Cutoff 300 ng/mL 600  Phencyclidine (PCP), urine      Cutoff 25 ng/mL 700  Cannabinoid, urine              Cutoff 50 ng/mL 800  Barbiturates, urine             Cutoff 200 ng/mL 900  Benzodiazepine, urine           Cutoff 200 ng/mL 1000 Methadone, urine                Cutoff 300 ng/mL 1100 1200 The urine drug screen provides only a preliminary, unconfirmed 1300 analytical test result and should not be used for non-medical 1400 purposes. Clinical consideration and professional judgment should 1500 be applied to any positive drug screen result due to possible 1600 interfering substances. A more specific alternate chemical method 1700 must be used in order to obtain a confirmed analytical result.  1800 Gas chromato graphy / mass spectrometry (GC/MS) is the preferred 1900 confirmatory method.     No current facility-administered medications for this encounter.    Current Outpatient Medications  Medication Sig Dispense Refill  . famotidine (PEPCID) 20 MG tablet Take 1 tablet (20 mg total) by mouth 2 (two) times daily. 60 tablet 1  . famotidine (PEPCID) 20 MG tablet Take 1 tablet (20 mg total) by  mouth 2 (two) times daily. 60 tablet 1  . hydrOXYzine (ATARAX/VISTARIL) 50 MG tablet Take 50 mg by mouth at bedtime.    Marland Kitchen LORazepam (ATIVAN) 0.5 MG tablet Take 1 tablet (0.5 mg total) by mouth at bedtime as needed for up to 3 days for sleep. 3 tablet 0  . OLANZapine (ZYPREXA) 10 MG tablet Take 1 tablet (10 mg total) by mouth at bedtime. 30 tablet 1  . ondansetron (ZOFRAN) 4 MG tablet Take 1 tablet (4 mg total) by mouth daily as needed for nausea or vomiting. 20 tablet 1  . sucralfate (CARAFATE) 1 g tablet Take 1 tablet (1 g total) by mouth 4 (four) times daily. 120 tablet 1    Musculoskeletal: Strength & Muscle Tone: within normal limits Gait & Station: normal Patient leans: N/A  Psychiatric Specialty Exam: Physical Exam  Nursing note and vitals reviewed. Constitutional: She appears well-developed and well-nourished.  HENT:  Head: Normocephalic and atraumatic.  Eyes: Conjunctivae are normal. Pupils are equal, round, and reactive to light.  Neck: Normal range of motion.  Cardiovascular: Regular rhythm and normal heart sounds.  Respiratory: Effort normal. No respiratory distress.  GI: Soft.  Musculoskeletal: Normal range of motion.  Neurological: She is alert.  Skin: Skin is warm and dry.  Psychiatric: Judgment normal. Her affect is blunt. Her speech is delayed. She is slowed. Cognition and  memory are normal. She expresses no homicidal and no suicidal ideation.    Review of Systems  Constitutional: Negative.   HENT: Negative.   Eyes: Negative.   Respiratory: Negative.   Cardiovascular: Negative.   Gastrointestinal: Negative.   Musculoskeletal: Negative.   Skin: Negative.   Neurological: Negative.   Psychiatric/Behavioral: Positive for hallucinations. Negative for depression, memory loss, substance abuse and suicidal ideas. The patient is nervous/anxious and has insomnia.     Blood pressure 112/60, pulse 93, temperature 98.3 F (36.8 C), temperature source Oral, resp. rate 16,  height '5\' 2"'$  (1.575 m), weight 86.2 kg (190 lb), SpO2 97 %.Body mass index is 34.75 kg/m.  General Appearance: Disheveled  Eye Contact:  Fair  Speech:  Slow  Volume:  Decreased  Mood:  Dysphoric  Affect:  Congruent  Thought Process:  Goal Directed  Orientation:  Full (Time, Place, and Person)  Thought Content:  Logical  Suicidal Thoughts:  No  Homicidal Thoughts:  No  Memory:  Immediate;   Fair Recent;   Fair Remote;   Fair  Judgement:  Fair  Insight:  Shallow  Psychomotor Activity:  Decreased  Concentration:  Concentration: Fair  Recall:  [employer] of Knowledge:  Fair  Language:  Fair  Akathisia:  No  Handed:  Right  AIMS (if indicated):     Assets:  Desire for Improvement Housing  ADL's:  Intact  Cognition:  WNL  Sleep:        Treatment Plan Summary: Medication management and Plan 51 year old woman who is complaining of auditory hallucinations.  She presents it as being a problem completely separate from her substance abuse but her drug screen is positive for cocaine.  Patient is very clear that she has no suicidal thoughts no homicidal thoughts and is not behaving in a dangerous way.  She simply wants some treatment for the hallucinations.  I told her that headaches were not a very common side effect for risk Toradol but at this point she is not going to start it again.  Continue off the Risperdal and I will give her a prescription for olanzapine 10 mg at night.  Side effects reviewed.  Patient encouraged to give this a try and then follow-up with R AJ.  No need for inpatient treatment she can be discharged and follow-up in the community.  Disposition: No evidence of imminent risk to self or others at present.   Patient does not meet criteria for psychiatric inpatient admission. Discussed crisis plan, support from social network, calling 911, coming to the Emergency Department, and calling Suicide Hotline.  Alethia Berthold, MD 09/16/2017 7:33 PM

## 2018-02-04 ENCOUNTER — Emergency Department
Admission: EM | Admit: 2018-02-04 | Discharge: 2018-02-04 | Disposition: A | Discharge status: Home / Self Care | Payer: Self-pay | Attending: Student in an Organized Health Care Education/Training Program | Admitting: Student in an Organized Health Care Education/Training Program

## 2018-02-04 ENCOUNTER — Other Ambulatory Visit: Payer: Self-pay

## 2018-02-04 ENCOUNTER — Encounter: Payer: Self-pay | Admitting: Emergency Medicine

## 2018-02-04 ENCOUNTER — Emergency Department: Payer: Self-pay

## 2018-02-04 DIAGNOSIS — R350 Frequency of micturition: Secondary | ICD-10-CM | POA: Insufficient documentation

## 2018-02-04 DIAGNOSIS — Z79899 Other long term (current) drug therapy: Secondary | ICD-10-CM | POA: Insufficient documentation

## 2018-02-04 DIAGNOSIS — R1012 Left upper quadrant pain: Secondary | ICD-10-CM | POA: Insufficient documentation

## 2018-02-04 DIAGNOSIS — F17210 Nicotine dependence, cigarettes, uncomplicated: Secondary | ICD-10-CM | POA: Insufficient documentation

## 2018-02-04 LAB — URINALYSIS, COMPLETE (UACMP) WITH MICROSCOPIC
BILIRUBIN URINE: NEGATIVE
Glucose, UA: NEGATIVE mg/dL
KETONES UR: 5 mg/dL — AB
Leukocytes, UA: NEGATIVE
NITRITE: NEGATIVE
PH: 6 (ref 5.0–8.0)
PROTEIN: NEGATIVE mg/dL
SPECIFIC GRAVITY, URINE: 1.006 (ref 1.005–1.030)

## 2018-02-04 LAB — CBC
HCT: 45.6 % (ref 35.0–47.0)
HEMOGLOBIN: 15.5 g/dL (ref 12.0–16.0)
MCH: 29.6 pg (ref 26.0–34.0)
MCHC: 34 g/dL (ref 32.0–36.0)
MCV: 87.1 fL (ref 80.0–100.0)
Platelets: 378 10*3/uL (ref 150–440)
RBC: 5.23 MIL/uL — AB (ref 3.80–5.20)
RDW: 14 % (ref 11.5–14.5)
WBC: 13.8 10*3/uL — ABNORMAL HIGH (ref 3.6–11.0)

## 2018-02-04 LAB — COMPREHENSIVE METABOLIC PANEL
ALK PHOS: 129 U/L — AB (ref 38–126)
ALT: 20 U/L (ref 14–54)
ANION GAP: 12 (ref 5–15)
AST: 23 U/L (ref 15–41)
Albumin: 4.5 g/dL (ref 3.5–5.0)
BILIRUBIN TOTAL: 0.5 mg/dL (ref 0.3–1.2)
BUN: 8 mg/dL (ref 6–20)
CALCIUM: 9.3 mg/dL (ref 8.9–10.3)
CO2: 23 mmol/L (ref 22–32)
Chloride: 101 mmol/L (ref 101–111)
Creatinine, Ser: 0.72 mg/dL (ref 0.44–1.00)
GFR calc Af Amer: >60 mL/min (ref >60)
GLUCOSE: 112 mg/dL — AB (ref 65–99)
POTASSIUM: 4.1 mmol/L (ref 3.5–5.1)
Sodium: 136 mmol/L (ref 135–145)
Total Protein: 8.4 g/dL — ABNORMAL HIGH (ref 6.5–8.1)

## 2018-02-04 LAB — LIPASE, BLOOD: Lipase: 28 U/L (ref 11–51)

## 2018-02-04 LAB — TROPONIN I

## 2018-02-04 MED ORDER — CEPHALEXIN 500 MG PO CAPS
500.0000 mg | ORAL_CAPSULE | Freq: Once | ORAL | Status: AC
  Administered 2018-02-04: 500 mg via ORAL
  Filled 2018-02-04: qty 1

## 2018-02-04 MED ORDER — PROMETHAZINE HCL 25 MG/ML IJ SOLN
12.5000 mg | Freq: Four times a day (QID) | INTRAMUSCULAR | Status: DC | PRN
  Administered 2018-02-04: 12.5 mg via INTRAVENOUS
  Filled 2018-02-04: qty 1

## 2018-02-04 MED ORDER — MORPHINE SULFATE (PF) 4 MG/ML IV SOLN
4.0000 mg | INTRAVENOUS | Status: DC | PRN
  Administered 2018-02-04: 4 mg via INTRAVENOUS
  Filled 2018-02-04: qty 1

## 2018-02-04 MED ORDER — CEPHALEXIN 500 MG PO CAPS: 500.0000 mg | ORAL_CAPSULE | Freq: Three times a day (TID) | ORAL | 0 refills | Status: AC

## 2018-02-04 MED ORDER — SUCRALFATE 1 G PO TABS: 1.0000 g | ORAL_TABLET | Freq: Four times a day (QID) | ORAL | 0 refills | Status: DC

## 2018-02-04 NOTE — ED Notes (Signed)
Patient ambulatory to lobby with steady gait and NAD noted. Verbalized understanding of discharge instructions and follow-up care.  

## 2018-02-04 NOTE — Discharge Instructions (Signed)

## 2018-02-04 NOTE — ED Provider Notes (Addendum)
[facility] Emergency Department Provider Note    First MD Initiated Contact with Patient 02/04/18 2157     (approximate)  I have reviewed the triage vital signs and the nursing notes.   HISTORY  Chief Complaint Abdominal Pain    HPI Karlea Mckibbin is a 52 y.o. female presents to the ER with chief complaint of mid upper and left upper abdominal pain radiating through to the back.  States she is never had pain like this before.  States she has had some increased urinary frequency but no burning.  No hematuria.  Never had pain like this before certainly not this severe.  Does have a history of reflux.  Status post hysterectomy.  Denies any measured fevers at home.  History reviewed. No pertinent past medical history. No family history on file. Past Surgical History:  Procedure Laterality Date  . ABDOMINAL HYSTERECTOMY    . CHOLECYSTECTOMY     Patient Active Problem List   Diagnosis Date Noted  . Psychosis ([facility]) 09/16/2017  . 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 Cocaine abuse ([facility]) 09/16/2017      Prior to Admission medications   Medication Sig Start Date End Date Taking? Authorizing Provider  famotidine (PEPCID) 20 MG tablet Take 1 tablet (20 mg total) by mouth 2 (two) times daily. 01/24/16 01/23/17  Jeanmarie Plant, MD  famotidine (PEPCID) 20 MG tablet Take 1 tablet (20 mg total) by mouth 2 (two) times daily. 03/03/16   Emily Filbert, MD  hydrOXYzine (ATARAX/VISTARIL) 50 MG tablet Take 50 mg by mouth at bedtime.    [provider]  OLANZapine (ZYPREXA) 10 MG tablet Take 1 tablet (10 mg total) by mouth at bedtime. 09/16/17 11/15/17  Clapacs, Jackquline Denmark, MD  ondansetron (ZOFRAN) 4 MG tablet Take 1 tablet (4 mg total) by mouth daily as needed for nausea or vomiting. 03/03/16   Emily Filbert, MD  sucralfate (CARAFATE) 1 g tablet Take 1 tablet (1 g total) by mouth 4 (four) times daily. 01/24/16 01/23/17  Jeanmarie Plant, MD     Allergies Contrast media [iodinated diagnostic agents]; Morphine and related; and Motrin [ibuprofen]    Social History Social History   Tobacco Use  . Smoking status: Current Every Day Smoker    Packs/day: 1.00    Types: Cigarettes  . Smokeless tobacco: Never Used  Substance Use Topics  . Alcohol use: No  . Drug use: No    Comment: HX meth    Review of Systems Patient denies headaches, rhinorrhea, blurry vision, numbness, shortness of breath, chest pain, edema, cough, abdominal pain, nausea, vomiting, diarrhea, dysuria, fevers, rashes or hallucinations unless otherwise stated above in HPI. ____________________________________________   PHYSICAL EXAM:  VITAL SIGNS: Vitals:   02/04/18 1930  BP: (!) 153/100  Pulse: (!) 113  Resp: 18  Temp: 97.7 F (36.5 C)  SpO2: 97%    Constitutional: Alert and oriented. Well appearing and in no acute distress. Eyes: Conjunctivae are normal.  Head: Atraumatic. Nose: No congestion/rhinnorhea. Mouth/Throat: Mucous membranes are moist.   Neck: No stridor. Painless ROM.  Cardiovascular: Normal rate, regular rhythm. Grossly normal heart sounds.  Good peripheral circulation. Respiratory: Normal respiratory effort.  No retractions. Lungs CTAB. Gastrointestinal: Soft and nontender. No distention. No abdominal bruits. No CVA tenderness. Genitourinary: deferred Musculoskeletal: No lower extremity tenderness nor edema.  No joint effusions. Neurologic:  Normal speech and language. No gross focal neurologic deficits are appreciated. No facial droop Skin:  Skin is warm, dry  and intact. No rash noted. Psychiatric: Mood and affect are normal. Speech and behavior are normal.  ____________________________________________   LABS (all labs ordered are listed, but only abnormal results are displayed)  Results for orders placed or performed during the hospital encounter of 02/04/18 (from the past 24 hour(s))  Lipase, blood     Status: None    Collection Time: 02/04/18  7:32 PM  Result Value Ref Range   Lipase 28 11 - 51 U/L  Comprehensive metabolic panel     Status: Abnormal   Collection Time: 02/04/18  7:32 PM  Result Value Ref Range   Sodium 136 135 - 145 mmol/L   Potassium 4.1 3.5 - 5.1 mmol/L   Chloride 101 101 - 111 mmol/L   CO2 23 22 - 32 mmol/L   Glucose, Bld 112 (H) 65 - 99 mg/dL   BUN 8 6 - 20 mg/dL   Creatinine, Ser 0.45 0.44 - 1.00 mg/dL   Calcium 9.3 8.9 - 40.9 mg/dL   Total Protein 8.4 (H) 6.5 - 8.1 g/dL   Albumin 4.5 3.5 - 5.0 g/dL   AST 23 15 - 41 U/L   ALT 20 14 - 54 U/L   Alkaline Phosphatase 129 (H) 38 - 126 U/L   Total Bilirubin 0.5 0.3 - 1.2 mg/dL   GFR calc non Af Amer >60 >60 mL/min   GFR calc Af Amer >60 >60 mL/min   Anion gap 12 5 - 15  CBC     Status: Abnormal   Collection Time: 02/04/18  7:32 PM  Result Value Ref Range   WBC 13.8 (H) 3.6 - 11.0 K/uL   RBC 5.23 (H) 3.80 - 5.20 MIL/uL   Hemoglobin 15.5 12.0 - 16.0 g/dL   HCT 81.1 91.4 - 78.2 %   MCV 87.1 80.0 - 100.0 fL   MCH 29.6 26.0 - 34.0 pg   MCHC 34.0 32.0 - 36.0 g/dL   RDW 95.6 21.3 - 08.6 %   Platelets 378 150 - 440 K/uL  Urinalysis, Complete w Microscopic     Status: Abnormal   Collection Time: 02/04/18  7:32 PM  Result Value Ref Range   Color, Urine YELLOW (A) YELLOW   APPearance CLEAR (A) CLEAR   Specific Gravity, Urine 1.006 1.005 - 1.030   pH 6.0 5.0 - 8.0   Glucose, UA NEGATIVE NEGATIVE mg/dL   Hgb urine dipstick MODERATE (A) NEGATIVE   Bilirubin Urine NEGATIVE NEGATIVE   Ketones, ur 5 (A) NEGATIVE mg/dL   Protein, ur NEGATIVE NEGATIVE mg/dL   Nitrite NEGATIVE NEGATIVE   Leukocytes, UA NEGATIVE NEGATIVE   RBC / HPF 0-5 0 - 5 RBC/hpf   WBC, UA 0-5 0 - 5 WBC/hpf   Bacteria, UA RARE (A) NONE SEEN   Squamous Epithelial / LPF 0-5 0 - 5  Troponin I     Status: None   Collection Time: 02/04/18  7:32 PM  Result Value Ref Range   Troponin I <0.03 <0.03 ng/mL   ____________________________________________ ED ECG  REPORT I, Willy Eddy, the attending physician, personally viewed and interpreted this ECG.   Date: 02/04/2018  EKG Time: 19:33  Rate: 105  Rhythm: sinus  Axis: normal  Intervals:normal intervals  ST&T Change: nonspecific t wave abnml  ____________________________________________  RADIOLOGY  I personally reviewed all radiographic images ordered to evaluate for the above acute complaints and reviewed radiology reports and findings.  These findings were personally discussed with the patient.  Please see medical record for radiology report.  ____________________________________________   PROCEDURES  Procedure(s) performed:  Procedures    Critical Care performed: no ____________________________________________   INITIAL IMPRESSION / ASSESSMENT AND PLAN / ED COURSE  Pertinent labs & imaging results that were available during my care of the patient were reviewed by me and considered in my medical decision making (see chart for details).  DDX: Colitis, diverticulitis, gastritis, stone, pancreatitis, UTI  Shahed Yeoman is a 52 y.o. who presents to the ED with symptoms as described above.  Abdominal exam with some left upper quadrant tenderness to palpation but certainly no peritonitis.  Urinalysis shows rare bacteria.  CT imaging ordered for the above differential she does have leukocytosis shows no evidence of acute abnormality.  No stone.  This point do believe patient stable and appropriate for outpatient follow-up.  Have discussed with the patient and available family all diagnostics and treatments performed thus far and all questions were answered to the best of my ability. The patient demonstrates understanding and agreement with plan.       As part of my medical decision making, I reviewed the following data within the electronic MEDICAL RECORD NUMBER Nursing notes reviewed and incorporated, Labs reviewed, notes from prior ED  visits.   ____________________________________________   FINAL CLINICAL IMPRESSION(S) / ED DIAGNOSES  Final diagnoses:  Left upper quadrant pain      NEW MEDICATIONS STARTED DURING THIS VISIT:  New Prescriptions   No medications on file     Note:  This document was prepared using Dragon voice recognition software and may include unintentional dictation errors.    Willy Eddy, MD 02/04/18 Juliann Pares    Willy Eddy, MD 02/12/18 [phone]

## 2018-02-04 NOTE — ED Triage Notes (Signed)
Pt reports mid upper and left upper abd pain radiating into back last few hours accomp by dizziness

## 2018-04-28 ENCOUNTER — Encounter: Payer: Self-pay | Admitting: Emergency Medicine

## 2018-04-28 ENCOUNTER — Emergency Department
Admission: EM | Admit: 2018-04-28 | Discharge: 2018-04-28 | Disposition: A | Discharge status: Home / Self Care | Payer: Self-pay | Attending: Student in an Organized Health Care Education/Training Program | Admitting: Student in an Organized Health Care Education/Training Program

## 2018-04-28 DIAGNOSIS — Y929 Unspecified place or not applicable: Secondary | ICD-10-CM | POA: Insufficient documentation

## 2018-04-28 DIAGNOSIS — Y939 Activity, unspecified: Secondary | ICD-10-CM | POA: Insufficient documentation

## 2018-04-28 DIAGNOSIS — F17210 Nicotine dependence, cigarettes, uncomplicated: Secondary | ICD-10-CM | POA: Insufficient documentation

## 2018-04-28 DIAGNOSIS — W57XXXA Bitten or stung by nonvenomous insect and other nonvenomous arthropods, initial encounter: Secondary | ICD-10-CM | POA: Insufficient documentation

## 2018-04-28 DIAGNOSIS — S40262A Insect bite (nonvenomous) of left shoulder, initial encounter: Secondary | ICD-10-CM | POA: Insufficient documentation

## 2018-04-28 DIAGNOSIS — Y998 Other external cause status: Secondary | ICD-10-CM | POA: Insufficient documentation

## 2018-04-28 MED ORDER — DOXYCYCLINE HYCLATE 100 MG PO CAPS: 100.0000 mg | ORAL_CAPSULE | Freq: Two times a day (BID) | ORAL | 0 refills | Status: DC

## 2018-04-28 NOTE — ED Triage Notes (Signed)
Patient presents to the ED with a small red area to her left shoulder, approx. Dime sized.  Patient states she noticed a tick crawling on her neck yesterday and killed it.  Patient states area itched during the night and is now redder than it was yesterday.  Patient is in no obvious distress at this time.

## 2018-04-28 NOTE — Discharge Instructions (Addendum)
Follow-up with your regular doctor or [facility] if you have any problems related to the tick bite.  Take the doxycycline 100 mg twice a day for 21 days.  If you are worsening please return the emergency department

## 2018-04-28 NOTE — ED Notes (Signed)
See triage note  Presents with possible tick bite to left upper back  Woke up with a small red area this am  Now also having pain moving into back and arm pit

## 2018-04-28 NOTE — ED Provider Notes (Signed)
[facility] Emergency Department Provider Note  ____________________________________________   First MD Initiated Contact with Patient 04/28/18 [phone]     (approximate)  I have reviewed the triage vital signs and the nursing notes.   HISTORY  Chief Complaint Tick Removal    HPI Suzzette RighterKimberly Egley is a 52 y.o. female presents emergency department complaining of tick bite to the left shoulder.  States area has been red and itchy.  Symptoms for 2 days.  She denies fever chills.  She denies any other rash.  History reviewed. No pertinent past medical history.  Patient Active Problem List   Diagnosis Date Noted  . Psychosis (HCC) 09/16/2017  . 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 Cocaine abuse (HCC) 09/16/2017    Past Surgical History:  Procedure Laterality Date  . ABDOMINAL HYSTERECTOMY    . CHOLECYSTECTOMY      Prior to Admission medications   Medication Sig Start Date End Date Taking? Authorizing Provider  doxycycline (VIBRAMYCIN) 100 MG capsule Take 1 capsule (100 mg total) by mouth 2 (two) times daily. 04/28/18   Faythe GheeFisher, Rahima Fleishman W, PA-C    Allergies Contrast media [iodinated diagnostic agents]; Morphine and related; and Motrin [ibuprofen]  No family history on file.  Social History Social History   Tobacco Use  . Smoking status: Current Every Day Smoker    Packs/day: 1.00    Types: Cigarettes  . Smokeless tobacco: Never Used  Substance Use Topics  . Alcohol use: No  . Drug use: No    Comment: HX meth    Review of Systems  Constitutional: No fever/chills Eyes: No visual changes. ENT: No sore throat. Respiratory: Denies cough Genitourinary: Negative for dysuria. Musculoskeletal: Negative for back pain. Skin: Negative for rash.  Positive tick bite    ____________________________________________   PHYSICAL EXAM:  VITAL SIGNS: ED Triage Vitals [04/28/18 0917]  Enc Vitals Group     BP 99/62     Pulse Rate 88     Resp 18     Temp 97.8 F (36.6 C)     Temp Source Oral     SpO2 98 %     Weight 190 lb (86.2 kg)     Height 5\' 2"  (1.575 m)     Head Circumference      Peak Flow      Pain Score 8     Pain Loc      Pain Edu?      Excl. in GC?     Constitutional: Alert and oriented. Well appearing and in no acute distress. Eyes: Conjunctivae are normal.  Head: Atraumatic. Nose: No congestion/rhinnorhea. Mouth/Throat: Mucous membranes are moist.   Cardiovascular: Normal rate, regular rhythm. Respiratory: Normal respiratory effort.  No retractions GU: deferred Musculoskeletal: FROM all extremities, warm and well perfused Neurologic:  Normal speech and language.  Skin:  Skin is warm, dry and intact. No rash noted.  Positive for red raised area on the left upper shoulder where the patient states the tick was removed.  No pustules or drainage are noted Psychiatric: Mood and affect are normal. Speech and behavior are normal.  ____________________________________________   LABS (all labs ordered are listed, but only abnormal results are displayed)  Labs Reviewed - No data to display ____________________________________________   ____________________________________________  RADIOLOGY    ____________________________________________   PROCEDURES  Procedure(s) performed: No  Procedures    ____________________________________________   INITIAL IMPRESSION / ASSESSMENT AND PLAN / ED COURSE  Pertinent labs & imaging results that were  available during my care of the patient were reviewed by me and considered in my medical decision making (see chart for details).  Patient is a 52 year old female presents emergency department complaining of a tick bite.  She denies fever chills.  On physical exam patient appears well.  She has a small reddish area on the left shoulder which is at the area where she remove the tick.  No drainage is noted.  Discussed findings with patient.  She is placed on  doxycycline 100 mg twice daily for 21 days.  She is to follow-up with her regular doctor if not better in 3-5.  Return if worsening.  States she understands will comply with our instructions.  Was discharged in stable condition     As part of my medical decision making, I reviewed the following data within the electronic MEDICAL RECORD NUMBER Nursing notes reviewed and incorporated, Notes from prior ED visits and [place] Controlled Substance Database  ____________________________________________   FINAL CLINICAL IMPRESSION(S) / ED DIAGNOSES  Final diagnoses:  Tick bite, initial encounter      NEW MEDICATIONS STARTED DURING THIS VISIT:  Discharge Medication List as of 04/28/2018  9:31 AM    START taking these medications   Details  doxycycline (VIBRAMYCIN) 100 MG capsule Take 1 capsule (100 mg total) by mouth 2 (two) times daily., Starting Mon 04/28/2018, Normal         Note:  This document was prepared using Dragon voice recognition software and may include unintentional dictation errors.    Faythe Ghee, PA-C 04/28/18 1053    Willy Eddy, MD 04/28/18 1329

## 2018-04-29 ENCOUNTER — Telehealth: Payer: Self-pay | Admitting: Medical Oncology

## 2018-04-29 NOTE — Telephone Encounter (Signed)
Attempted to return pts call x 3 about changing RX of doxy d/t cost. Per Dr Roxan Hockeyobinson, no other medication can be prescribed.

## 2018-05-12 ENCOUNTER — Emergency Department
Admission: EM | Admit: 2018-05-12 | Discharge: 2018-05-12 | Disposition: A | Discharge status: Home / Self Care | Payer: Self-pay | Attending: Emergency Medicine | Admitting: Emergency Medicine

## 2018-05-12 ENCOUNTER — Encounter: Payer: Self-pay | Admitting: *Deleted

## 2018-05-12 ENCOUNTER — Emergency Department: Payer: Self-pay

## 2018-05-12 ENCOUNTER — Other Ambulatory Visit: Payer: Self-pay

## 2018-05-12 DIAGNOSIS — R0602 Shortness of breath: Secondary | ICD-10-CM | POA: Insufficient documentation

## 2018-05-12 DIAGNOSIS — F17210 Nicotine dependence, cigarettes, uncomplicated: Secondary | ICD-10-CM | POA: Insufficient documentation

## 2018-05-12 DIAGNOSIS — R1011 Right upper quadrant pain: Secondary | ICD-10-CM | POA: Insufficient documentation

## 2018-05-12 DIAGNOSIS — R109 Unspecified abdominal pain: Secondary | ICD-10-CM

## 2018-05-12 LAB — LIPASE, BLOOD: Lipase: 35 U/L (ref 11–51)

## 2018-05-12 LAB — COMPREHENSIVE METABOLIC PANEL
ALBUMIN: 4.3 g/dL (ref 3.5–5.0)
ALT: 20 U/L (ref 0–44)
ANION GAP: 11 (ref 5–15)
AST: 25 U/L (ref 15–41)
Alkaline Phosphatase: 110 U/L (ref 38–126)
BILIRUBIN TOTAL: 0.7 mg/dL (ref 0.3–1.2)
BUN: 15 mg/dL (ref 6–20)
CO2: 23 mmol/L (ref 22–32)
Calcium: 9.3 mg/dL (ref 8.9–10.3)
Chloride: 105 mmol/L (ref 98–111)
Creatinine, Ser: 0.79 mg/dL (ref 0.44–1.00)
GFR calc Af Amer: >60 mL/min (ref >60)
Glucose, Bld: 103 mg/dL — ABNORMAL HIGH (ref 70–99)
POTASSIUM: 3.7 mmol/L (ref 3.5–5.1)
Sodium: 139 mmol/L (ref 135–145)
TOTAL PROTEIN: 8 g/dL (ref 6.5–8.1)

## 2018-05-12 LAB — URINALYSIS, COMPLETE (UACMP) WITH MICROSCOPIC
BILIRUBIN URINE: NEGATIVE
Bacteria, UA: NONE SEEN
Glucose, UA: NEGATIVE mg/dL
Ketones, ur: NEGATIVE mg/dL
LEUKOCYTES UA: NEGATIVE
NITRITE: NEGATIVE
PROTEIN: NEGATIVE mg/dL
Specific Gravity, Urine: 1.017 (ref 1.005–1.030)
pH: 5 (ref 5.0–8.0)

## 2018-05-12 LAB — CBC
HEMATOCRIT: 41.7 % (ref 35.0–47.0)
HEMOGLOBIN: 14.5 g/dL (ref 12.0–16.0)
MCH: 30.9 pg (ref 26.0–34.0)
MCHC: 34.8 g/dL (ref 32.0–36.0)
MCV: 88.8 fL (ref 80.0–100.0)
Platelets: 397 10*3/uL (ref 150–440)
RBC: 4.7 MIL/uL (ref 3.80–5.20)
RDW: 13.5 % (ref 11.5–14.5)
WBC: 12.9 10*3/uL — AB (ref 3.6–11.0)

## 2018-05-12 LAB — TROPONIN I

## 2018-05-12 MED ORDER — FAMOTIDINE 40 MG PO TABS: 40.0000 mg | ORAL_TABLET | Freq: Every evening | ORAL | 1 refills | Status: DC

## 2018-05-12 MED ORDER — SUCRALFATE 1 G PO TABS: 1.0000 g | ORAL_TABLET | Freq: Four times a day (QID) | ORAL | 0 refills | Status: DC

## 2018-05-12 MED ORDER — GI COCKTAIL ~~LOC~~
30.0000 mL | Freq: Once | ORAL | Status: AC
  Administered 2018-05-12: 30 mL via ORAL
  Filled 2018-05-12: qty 30

## 2018-05-12 NOTE — ED Notes (Signed)
Pt eating and drinking without difficulty.  

## 2018-05-12 NOTE — ED Notes (Signed)
Patient discharged to home per MD order. Patient in stable condition, and deemed medically cleared by ED provider for discharge. Discharge instructions reviewed with patient/family using "Teach Back"; verbalized understanding of medication education and administration, and information about follow-up care. Denies further concerns. ° °

## 2018-05-12 NOTE — ED Notes (Signed)
Report received from Velva HarmanLaura RN, patient care transferred. Pt watching tv states feels shob but no distress noted at this time. Pt also co epigastric pain rates it 10/10 at this time. Monitor in place, NSR noted on monitor.

## 2018-05-12 NOTE — ED Provider Notes (Signed)
[facility] Emergency Department Provider Note  ______________________________________   I have reviewed the triage vital signs and the nursing notes.   HISTORY  Chief Complaint Shortness of Breath and Abdominal Pain   History limited by: Not Limited   HPI Mary Hall is a 52 y.o. female who presents to the emergency department today with concerns for right upper quadrant pain and shortness of breath.  Patient states that symptoms started today.  She was sitting down when the symptoms started.  Patient describes the pain as being located in the epigastric right upper quadrant.  She states it is in the spot where her gallbladder would be however she is status post cholecystectomy.  In addition to the abdominal pain she has also been having some shortness of breath.  She does admit to smoking.  Had similar symptoms a few months ago and [facility].  She states that shortness of breath today is worse than it was then.    Per medical record review patient has a history of [facility].  Negative work-up at that time including negative d-dimer x-ray.  History reviewed. No pertinent past medical history.  Patient Active Problem List   Diagnosis Date Noted  . Psychosis (HCC) 09/16/2017  . 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 Cocaine abuse (HCC) 09/16/2017    Past Surgical History:  Procedure Laterality Date  . ABDOMINAL HYSTERECTOMY    . CHOLECYSTECTOMY      Prior to Admission medications   Medication Sig Start Date End Date Taking? Authorizing Provider  doxycycline (VIBRAMYCIN) 100 MG capsule Take 1 capsule (100 mg total) by mouth 2 (two) times daily. 04/28/18   Faythe GheeFisher, Susan W, PA-C    Allergies Contrast media [iodinated diagnostic agents]; Morphine and related; and Motrin [ibuprofen]  History reviewed. No pertinent family history.  Social History Social History   Tobacco Use  .  Smoking status: Current Every Day Smoker    Packs/day: 1.00    Types: Cigarettes  . Smokeless tobacco: Never Used  Substance Use Topics  . Alcohol use: No  . Drug use: No    Comment: HX meth    Review of Systems Constitutional: No fever/chills Eyes: No visual changes. ENT: No sore throat. Cardiovascular: Denies chest pain. Respiratory: Positive shortness of breath. Gastrointestinal: Positive right upper quadrant pain Genitourinary: Negative for dysuria. Musculoskeletal: Negative for back pain. Skin: Negative for rash. Neurological: Negative for headaches, focal weakness or numbness.  ____________________________________________   PHYSICAL EXAM:  VITAL SIGNS: ED Triage Vitals  Enc Vitals Group     BP 05/12/18 1735 (!) 123/91     Pulse Rate 05/12/18 1735 81     Resp 05/12/18 1735 16     Temp 05/12/18 1735 97.7 F (36.5 C)     Temp Source 05/12/18 1735 Oral     SpO2 05/12/18 1735 100 %     Weight 05/12/18 1736 190 lb (86.2 kg)     Height 05/12/18 1736 5\' 2"  (1.575 m)     Head Circumference --      Peak Flow --      Pain Score 05/12/18 1736 10   Constitutional: Alert and oriented.  Eyes: Conjunctivae are normal.  ENT      Head: Normocephalic and atraumatic.      Nose: No congestion/rhinnorhea.      Mouth/Throat: Mucous membranes are moist.      Neck: No stridor. Hematological/Lymphatic/Immunilogical: No cervical  lymphadenopathy. Cardiovascular: Normal rate, regular rhythm.  No murmurs, rubs, or gallops.  Respiratory: Normal respiratory effort without tachypnea nor retractions. Breath sounds are clear and equal bilaterally. No wheezes/rales/rhonchi. Gastrointestinal: Soft and minimally tender in the epigastric right upper quadrant. No rebound. No guarding.  Genitourinary: Deferred Musculoskeletal: Normal range of motion in all extremities. No lower extremity edema. Neurologic:  Normal speech and language. No gross focal neurologic deficits are appreciated.  Skin:   Skin is warm, dry and intact. No rash noted. Psychiatric: Mood and affect are normal. Speech and behavior are normal. Patient exhibits appropriate insight and judgment.  ____________________________________________    LABS (pertinent positives/negatives)  Lipase 35 UA hazy, moderate urine, neg nitrite and leukocytes, 11-10 rbc, 0-5 wbc, none seen bacteria CMP wnl except glu 103 CBC wbc 12.9, hgb 14.5, plt 397 Trop <0.03  ____________________________________________   EKG  I, Phineas SemenGraydon Jeriko Kowalke, attending physician, personally viewed and interpreted this EKG  EKG Time: 1752 Rate: 86 Rhythm: normal sinus rhythm Axis: normal Intervals: qtc 433 QRS: narrow ST changes: no st elevation Impression: normal ekg  ____________________________________________    RADIOLOGY  CXR No acute abnormality   ____________________________________________   PROCEDURES  Procedures  ____________________________________________   INITIAL IMPRESSION / ASSESSMENT AND PLAN / ED COURSE  Pertinent labs & imaging results that were available during my care of the patient were reviewed by me and considered in my medical decision making (see chart for details).   Resented to the emergency department today because of concerns for abdominal pain and shortness of breath.  Work-up without concerning x-ray findings.  Blood work shows a very mild leukocytosis of unclear etiology.  Patient did feel better after GI cocktail.  Will discharge with medication.  ____________________________________________   FINAL CLINICAL IMPRESSION(S) / ED DIAGNOSES  Final diagnoses:  Abdominal pain, unspecified abdominal location  Shortness of breath     Note: This dictation was prepared with Dragon dictation. Any transcriptional errors that result from this process are unintentional     Phineas SemenGoodman, Barnaby Rippeon, MD 05/12/18 2138

## 2018-05-12 NOTE — Discharge Instructions (Addendum)
Please seek medical attention for any high fevers, chest pain, shortness of breath, change in behavior, persistent vomiting, bloody stool or any other new or concerning symptoms.  

## 2018-05-12 NOTE — ED Triage Notes (Signed)
Pt to ED reporting increased SOB and increased WOB that started suddenly 2 hours ago. Pt reports the pain radiates into her RUQ when she coughs but reports a chronic smokers cough. Intermittent lightheaded episodes with reports of feeling a near syncopal episode while waiting in the lobby for triage.

## 2018-05-12 NOTE — ED Notes (Signed)
Pt states her pain has resolved no complaints.

## 2018-06-21 ENCOUNTER — Emergency Department
Admission: EM | Admit: 2018-06-21 | Discharge: 2018-06-21 | Discharge status: Against Medical Advice | Payer: Self-pay | Attending: Emergency Medicine | Admitting: Emergency Medicine

## 2018-06-21 ENCOUNTER — Other Ambulatory Visit: Payer: Self-pay

## 2018-06-21 ENCOUNTER — Emergency Department: Payer: Self-pay

## 2018-06-21 ENCOUNTER — Encounter: Payer: Self-pay | Admitting: Emergency Medicine

## 2018-06-21 DIAGNOSIS — R05 Cough: Secondary | ICD-10-CM | POA: Insufficient documentation

## 2018-06-21 DIAGNOSIS — R0602 Shortness of breath: Secondary | ICD-10-CM | POA: Insufficient documentation

## 2018-06-21 DIAGNOSIS — F17210 Nicotine dependence, cigarettes, uncomplicated: Secondary | ICD-10-CM | POA: Insufficient documentation

## 2018-06-21 DIAGNOSIS — Z79899 Other long term (current) drug therapy: Secondary | ICD-10-CM | POA: Insufficient documentation

## 2018-06-21 DIAGNOSIS — M546 Pain in thoracic spine: Secondary | ICD-10-CM | POA: Insufficient documentation

## 2018-06-21 DIAGNOSIS — R059 Cough, unspecified: Secondary | ICD-10-CM

## 2018-06-21 LAB — CBC WITH DIFFERENTIAL/PLATELET
Basophils Absolute: 0.1 10*3/uL (ref 0–0.1)
Basophils Relative: 1 %
EOS ABS: 0.2 10*3/uL (ref 0–0.7)
EOS PCT: 2 %
HCT: 39.6 % (ref 35.0–47.0)
Hemoglobin: 14 g/dL (ref 12.0–16.0)
LYMPHS ABS: 3.5 10*3/uL (ref 1.0–3.6)
LYMPHS PCT: 34 %
MCH: 30.7 pg (ref 26.0–34.0)
MCHC: 35.3 g/dL (ref 32.0–36.0)
MCV: 86.9 fL (ref 80.0–100.0)
MONO ABS: 0.9 10*3/uL (ref 0.2–0.9)
MONOS PCT: 9 %
Neutro Abs: 5.7 10*3/uL (ref 1.4–6.5)
Neutrophils Relative %: 54 %
PLATELETS: 366 10*3/uL (ref 150–440)
RBC: 4.56 MIL/uL (ref 3.80–5.20)
RDW: 13.4 % (ref 11.5–14.5)
WBC: 10.4 10*3/uL (ref 3.6–11.0)

## 2018-06-21 LAB — BASIC METABOLIC PANEL
Anion gap: 11 (ref 5–15)
BUN: 11 mg/dL (ref 6–20)
CO2: 21 mmol/L — ABNORMAL LOW (ref 22–32)
CREATININE: 0.73 mg/dL (ref 0.44–1.00)
Calcium: 9.1 mg/dL (ref 8.9–10.3)
Chloride: 107 mmol/L (ref 98–111)
GFR calc Af Amer: >60 mL/min (ref >60)
Glucose, Bld: 126 mg/dL — ABNORMAL HIGH (ref 70–99)
POTASSIUM: 3.3 mmol/L — AB (ref 3.5–5.1)
SODIUM: 139 mmol/L (ref 135–145)

## 2018-06-21 LAB — TROPONIN I

## 2018-06-21 LAB — FIBRIN DERIVATIVES D-DIMER (ARMC ONLY): Fibrin derivatives D-dimer (ARMC): 342.65 ng/mL (FEU) (ref 0.00–499.00)

## 2018-06-21 NOTE — ED Triage Notes (Signed)
Cough and back pain x 2 days.

## 2018-06-21 NOTE — ED Provider Notes (Signed)
[facility] Emergency Department Provider Note ____________________________________________  Time seen: 1935  I have reviewed the triage vital signs and the nursing notes.  HISTORY  Chief Complaint  Cough and Back Pain   HPI Mary Hall is a 52 y.o. female presents to the ER today with complaint of nasal congestion and cough.  She reports this started 2 days ago.  She is not able to blow anything out of her nose.  The cough is productive of clear mucus.  She reports associated pressure between her shoulder blades and shortness of breath.  She also reports numbness of her face and bilateral upper and lower extremities.  She denies runny nose, ear pain or sore throat.  She denies chest pain or chest tightness.  She.denies headaches,  dizziness or weakness.  She has tried a decongestant prior to arrival with minimal relief.  She is a smoker.  She denies recent drug use.  History reviewed. No pertinent past medical history.  Patient Active Problem List   Diagnosis Date Noted  . Psychosis (HCC) 09/16/2017  . 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 Cocaine abuse (HCC) 09/16/2017    Past Surgical History:  Procedure Laterality Date  . ABDOMINAL HYSTERECTOMY    . CHOLECYSTECTOMY      Prior to Admission medications   Medication Sig Start Date End Date Taking? Authorizing Provider  doxycycline (VIBRAMYCIN) 100 MG capsule Take 1 capsule (100 mg total) by mouth 2 (two) times daily. Patient not taking: Reported on 05/12/2018 04/28/18   Faythe Ghee, PA-C  famotidine (PEPCID) 40 MG tablet Take 1 tablet (40 mg total) by mouth every evening. 05/12/18 05/12/19  Phineas Semen, MD  sucralfate (CARAFATE) 1 g tablet Take 1 tablet (1 g total) by mouth 4 (four) times daily. 05/12/18   Phineas Semen, MD    Allergies Contrast media [iodinated diagnostic agents]; Morphine and related; and Motrin [ibuprofen]  No family history on file.  Social History Social History    Tobacco Use  . Smoking status: Current Every Day Smoker    Packs/day: 1.00    Types: Cigarettes  . Smokeless tobacco: Never Used  Substance Use Topics  . Alcohol use: No  . Drug use: No    Comment: HX meth    Review of Systems  Constitutional: Negative for fever, chills or body aches. Eyes: Negative for visual changes. ENT: Positive for nasal congestion.  Negative for runny nose, ear pain or sore throat. Cardiovascular: Negative for chest pain chest tightness. Respiratory: Positive for cough and shortness of breath.   Gastrointestinal: Negative for abdominal pain, vomiting and diarrhea. Musculoskeletal: Positive for pain between shoulder blades.  Negative for weakness. Skin: Negative for rash. Neurological: Positive for generalized numbness.  Negative for headaches, dizziness or focal weakness. ____________________________________________  PHYSICAL EXAM:  VITAL SIGNS: ED Triage Vitals  Enc Vitals Group     BP 06/21/18 1843 117/90     Pulse Rate 06/21/18 1843 (!) 103     Resp 06/21/18 1843 20     Temp 06/21/18 1843 98.1 F (36.7 C)     Temp Source 06/21/18 1843 Oral     SpO2 06/21/18 1843 99 %     Weight 06/21/18 1844 180 lb (81.6 kg)     Height 06/21/18 1844 5\' 2"  (1.575 m)     Head Circumference --      Peak Flow --      Pain Score 06/21/18 1843 10     Pain Loc --  Pain Edu? --      Excl. in GC? --     Constitutional: Alert and oriented. Well appearing and in no distress. Head: Normocephalic and atraumatic. Eyes: Conjunctivae are normal. PERRL. Normal extraocular movements Ears: cerumen impaction on the right. TM on the left grey and intact. No effusion. Nose: No congestion/rhinorrhea/epistaxis. Mouth/Throat: Mucous membranes are moist. + PND. Hematological/Lymphatic/Immunological: No cervical lymphadenopathy. Cardiovascular: Tachycardic, regular rhythm. Normal distal pulses. Respiratory: Normal respiratory effort. No  wheezes/rales/rhonchi. Gastrointestinal: Soft and nontender. No distention. Musculoskeletal: Strength 5/5 BUE/BLE. Neurologic:  Normal speech and language. No gross focal neurologic deficits are appreciated. Skin:  Skin is warm, dry and intact. No rash noted.  ____________________________________________   LABS CBC    Component Value Date/Time   WBC 10.4 06/21/2018 1958   RBC 4.56 06/21/2018 1958   HGB 14.0 06/21/2018 1958   HGB 12.0 02/13/2012 1213   HCT 39.6 06/21/2018 1958   HCT 35.8 02/13/2012 1213   PLT 366 06/21/2018 1958   PLT 353 02/13/2012 1213   MCV 86.9 06/21/2018 1958   MCV 84 02/13/2012 1213   MCH 30.7 06/21/2018 1958   MCHC 35.3 06/21/2018 1958   RDW 13.4 06/21/2018 1958   RDW 15.4 (H) 02/13/2012 1213   LYMPHSABS 3.5 06/21/2018 1958   MONOABS 0.9 06/21/2018 1958   EOSABS 0.2 06/21/2018 1958   BASOSABS 0.1 06/21/2018 1958   BMET    Component Value Date/Time   NA 139 06/21/2018 1958   NA 142 02/13/2012 1213   K 3.3 (L) 06/21/2018 1958   K 3.7 02/13/2012 1213   CL 107 06/21/2018 1958   CL 111 (H) 02/13/2012 1213   CO2 21 (L) 06/21/2018 1958   CO2 23 02/13/2012 1213   GLUCOSE 126 (H) 06/21/2018 1958   GLUCOSE 105 (H) 02/13/2012 1213   BUN 11 06/21/2018 1958   BUN 6 (L) 02/13/2012 1213   CREATININE 0.73 06/21/2018 1958   CREATININE 0.55 (L) 02/13/2012 1213   CALCIUM 9.1 06/21/2018 1958   CALCIUM 8.4 (L) 02/13/2012 1213   GFRNONAA >60 06/21/2018 1958   GFRNONAA >60 02/13/2012 1213   GFRAA >60 06/21/2018 1958   GFRAA >60 02/13/2012 1213    Troponin: < 0.3  Pt left before D dimer resulted ____________________________________________  EKG  Indication for ECG: back pain/pressure, shortness of breath Interpretation: Normal rate, rhythm, QRS ____________________________________________   RADIOLOGY  Imaging Orders     DG Chest 2 View IMPRESSION: No active cardiopulmonary disease. __________________________________________   INITIAL  IMPRESSION / ASSESSMENT AND PLAN / ED COURSE  Nasal Congestion, Cough, Back Pain, Paresthesias:  DDX include acute bronchitis, pneumonia, viral respiratory infection, PE Chest xray negative for infection CBC, CMET, Troponin negative Pt left without waiting for D dimer results  ____________________________________________  FINAL CLINICAL IMPRESSION(S) / ED DIAGNOSES  Final diagnoses:  None      Lorre MunroeBaity, Nickalas Mccarrick W, NP 06/21/18 2141    Sharman CheekStafford, Phillip, MD 06/22/18 1919

## 2018-06-21 NOTE — ED Notes (Signed)
Pt came to the desk asking if the other nurse knew if her "last test was back". Advised I didn't know but would find out when she came back. Pt states "this is ridiculous that she had "all these tests" and wasn't waiting any more and told her husband to "come on I'm not waiting for this". PA there as the pt walked out. She did not have an iv in. Did not want to talk to the pa or await discharge.

## 2019-02-20 ENCOUNTER — Encounter: Payer: Self-pay | Admitting: Emergency Medicine

## 2019-02-20 ENCOUNTER — Emergency Department
Admission: EM | Admit: 2019-02-20 | Discharge: 2019-02-20 | Disposition: A | Discharge status: Home / Self Care | Payer: Self-pay | Attending: Emergency Medicine | Admitting: Emergency Medicine

## 2019-02-20 ENCOUNTER — Other Ambulatory Visit: Payer: Self-pay

## 2019-02-20 ENCOUNTER — Emergency Department: Payer: Self-pay

## 2019-02-20 DIAGNOSIS — R059 Cough, unspecified: Secondary | ICD-10-CM

## 2019-02-20 DIAGNOSIS — R05 Cough: Secondary | ICD-10-CM

## 2019-02-20 DIAGNOSIS — F17210 Nicotine dependence, cigarettes, uncomplicated: Secondary | ICD-10-CM | POA: Insufficient documentation

## 2019-02-20 DIAGNOSIS — Z79899 Other long term (current) drug therapy: Secondary | ICD-10-CM | POA: Insufficient documentation

## 2019-02-20 DIAGNOSIS — J40 Bronchitis, not specified as acute or chronic: Secondary | ICD-10-CM | POA: Insufficient documentation

## 2019-02-20 LAB — BASIC METABOLIC PANEL
Anion gap: 11 (ref 5–15)
BUN: 11 mg/dL (ref 6–20)
CO2: 23 mmol/L (ref 22–32)
Calcium: 9.2 mg/dL (ref 8.9–10.3)
Chloride: 105 mmol/L (ref 98–111)
Creatinine, Ser: 0.64 mg/dL (ref 0.44–1.00)
GFR calc Af Amer: >60 mL/min (ref >60)
GFR calc non Af Amer: >60 mL/min (ref >60)
Glucose, Bld: 110 mg/dL — ABNORMAL HIGH (ref 70–99)
Potassium: 3.7 mmol/L (ref 3.5–5.1)
Sodium: 139 mmol/L (ref 135–145)

## 2019-02-20 LAB — CBC
HCT: 42.4 % (ref 36.0–46.0)
Hemoglobin: 14.3 g/dL (ref 12.0–15.0)
MCH: 29.9 pg (ref 26.0–34.0)
MCHC: 33.7 g/dL (ref 30.0–36.0)
MCV: 88.7 fL (ref 80.0–100.0)
Platelets: 397 10*3/uL (ref 150–400)
RBC: 4.78 MIL/uL (ref 3.87–5.11)
RDW: 13.3 % (ref 11.5–15.5)
WBC: 11.4 10*3/uL — ABNORMAL HIGH (ref 4.0–10.5)
nRBC: 0 % (ref 0.0–0.2)

## 2019-02-20 LAB — TROPONIN I: Troponin I: <0.03 ng/mL (ref <0.03)

## 2019-02-20 MED ORDER — ACETAMINOPHEN 500 MG PO TABS
1000.0000 mg | ORAL_TABLET | Freq: Once | ORAL | Status: AC
  Administered 2019-02-20: 15:00:00 1000 mg via ORAL
  Filled 2019-02-20: qty 2

## 2019-02-20 MED ORDER — PREDNISONE 20 MG PO TABS: 60.0000 mg | ORAL_TABLET | Freq: Every day | ORAL | 0 refills | Status: AC

## 2019-02-20 MED ORDER — SODIUM CHLORIDE 0.9% FLUSH: 3.0000 mL | Freq: Once | INTRAVENOUS | Status: DC

## 2019-02-20 MED ORDER — ALBUTEROL SULFATE HFA 108 (90 BASE) MCG/ACT IN AERS: 2.0000 | INHALATION_SPRAY | Freq: Four times a day (QID) | RESPIRATORY_TRACT | 2 refills | Status: DC | PRN

## 2019-02-20 MED ORDER — PREDNISONE 20 MG PO TABS
60.0000 mg | ORAL_TABLET | Freq: Once | ORAL | Status: AC
  Administered 2019-02-20: 60 mg via ORAL
  Filled 2019-02-20: qty 3

## 2019-02-20 NOTE — ED Triage Notes (Signed)
C/O cough x 3 days.  Today had a coughing spell, pulse was elevated to 129 and also c/o headache today.  Patient is AAOx3.  Skin warm and dry. NAD

## 2019-02-20 NOTE — ED Provider Notes (Signed)
[facility] Emergency Department Provider Note  ____________________________________________  Time seen: Approximately 1:48 PM  I have reviewed the triage vital signs and the nursing notes.   HISTORY  Chief Complaint Cough   HPI Mary Hall is a 53 y.o. female with a history of smoking, cocaine and amphetamine abuse who presents for evaluation of cough.  Patient reports 3 days of a dry cough which she attributed to her smoking.  Today she had a more severe coughing spell.  During the spell she had some shortness of breath, wheezing.  She noted that her pulse was elevated in the 120s. She felt dizzy.   After coughing she also developed a mild headache.  No fever, no chest pain, no vomiting or diarrhea, no personal family history of blood clots, no recent travel immobilization, no leg pain or swelling, no hemoptysis, no exogenous hormones.  Patient denies any chest pain at this time.  Still complaining of mild shortness of breath.  Has had intermittent wheezing.  Patient does not have any inhalers at home.  Does not have a primary care doctor.  Patient Active Problem List   Diagnosis Date Noted  . Psychosis (HCC) 09/16/2017  . 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 Cocaine abuse (HCC) 09/16/2017    Past Surgical History:  Procedure Laterality Date  . ABDOMINAL HYSTERECTOMY    . CHOLECYSTECTOMY      Prior to Admission medications   Medication Sig Start Date End Date Taking? Authorizing Provider  albuterol (VENTOLIN HFA) 108 (90 Base) MCG/ACT inhaler Inhale 2 puffs into the lungs every 6 (six) hours as needed for wheezing or shortness of breath. 02/20/19   Don PerkingVeronese, [place], MD  doxycycline (VIBRAMYCIN) 100 MG capsule Take 1 capsule (100 mg total) by mouth 2 (two) times daily. Patient not taking: Reported on 05/12/2018 04/28/18   Faythe GheeFisher, Susan W, PA-C  famotidine (PEPCID) 40 MG tablet Take 1 tablet (40 mg total) by mouth every evening. 05/12/18 05/12/19   Phineas SemenGoodman, [place], MD  predniSONE (DELTASONE) 20 MG tablet Take 3 tablets (60 mg total) by mouth daily for 4 days. 02/20/19 02/24/19  Nita SickleVeronese, [place], MD  sucralfate (CARAFATE) 1 g tablet Take 1 tablet (1 g total) by mouth 4 (four) times daily. 05/12/18   Phineas SemenGoodman, [place], MD    Allergies Contrast media [iodinated diagnostic agents]; Morphine and related; and Motrin [ibuprofen]  No family history on file.  Social History Social History   Tobacco Use  . Smoking status: Current Every Day Smoker    Packs/day: 1.00    Types: Cigarettes  . Smokeless tobacco: Never Used  Substance Use Topics  . Alcohol use: No  . Drug use: No    Comment: HX meth    Review of Systems  Constitutional: Negative for fever. Eyes: Negative for visual changes. ENT: Negative for sore throat. Neck: No neck pain  Cardiovascular: Negative for chest pain. Respiratory: + shortness of breath, wheezing, cough Gastrointestinal: Negative for abdominal pain, vomiting or diarrhea. Genitourinary: Negative for dysuria. Musculoskeletal: Negative for back pain. Skin: Negative for rash. Neurological: Negative for weakness or numbness. + HA Psych: No SI or HI  ____________________________________________   PHYSICAL EXAM:  VITAL SIGNS: ED Triage Vitals  Enc Vitals Group     BP 02/20/19 1236 (!) 158/97     Pulse Rate 02/20/19 1236 98     Resp 02/20/19 1236 16     Temp 02/20/19 1236 98 F (36.7 C)     Temp Source 02/20/19 1236 Oral  SpO2 02/20/19 1236 99 %     Weight 02/20/19 1234 190 lb (86.2 kg)     Height 02/20/19 1234 5\' 2"  (1.575 m)     Head Circumference --      Peak Flow --      Pain Score 02/20/19 1234 9     Pain Loc --      Pain Edu? --      Excl. in GC? --     Constitutional: Alert and oriented. Well appearing and in no apparent distress. HEENT:      Head: Normocephalic and atraumatic.         Eyes: Conjunctivae are normal. Sclera is non-icteric.       Mouth/Throat: Mucous membranes are  moist.       Neck: Supple with no signs of meningismus. Cardiovascular: Regular rate and rhythm. No murmurs, gallops, or rubs. 2+ symmetrical distal pulses are present in all extremities. No JVD. Respiratory: Normal respiratory effort. Lungs are clear to auscultation bilaterally. No wheezes, crackles, or rhonchi.  Gastrointestinal: Soft, non tender, and non distended with positive bowel sounds. No rebound or guarding. Musculoskeletal: Nontender with normal range of motion in all extremities. No edema, cyanosis, or erythema of extremities. Neurologic: Normal speech and language. Face is symmetric. Moving all extremities. No gross focal neurologic deficits are appreciated. Skin: Skin is warm, dry and intact. No rash noted. Psychiatric: Mood and affect are normal. Speech and behavior are normal.  ____________________________________________   LABS (all labs ordered are listed, but only abnormal results are displayed)  Labs Reviewed  BASIC METABOLIC PANEL - Abnormal; Notable for the following components:      Result Value   Glucose, Bld 110 (*)    All other components within normal limits  CBC - Abnormal; Notable for the following components:   WBC 11.4 (*)    All other components within normal limits  TROPONIN I  POC URINE PREG, ED   ____________________________________________  EKG  ED ECG REPORT I, Nita Sickle, the attending physician, personally viewed and interpreted this ECG.  Normal sinus rhythm, rate of 95, normal intervals, normal axis, no ST elevations or depressions.  Normal EKG. ____________________________________________  RADIOLOGY  I have personally reviewed the images performed during this visit and I agree with the Radiologist's read.   Interpretation by Radiologist:  Dg Chest Port 1 View  Result Date: 02/20/2019 CLINICAL DATA:  Cough for 3 days.  Tachycardia with headache. EXAM: PORTABLE CHEST 1 VIEW COMPARISON:  Radiographs 06/21/2018 and 05/12/2018.  FINDINGS: 1334 hours. The heart size and mediastinal contours are normal. The lungs are clear. There is no pleural effusion or pneumothorax. No acute osseous findings are identified. IMPRESSION: Stable chest.  No active cardiopulmonary process. Electronically Signed   By: Carey Bullocks M.D.   On: 02/20/2019 13:59      ____________________________________________   PROCEDURES  Procedure(s) performed: None Procedures Critical Care performed:  None ____________________________________________   INITIAL IMPRESSION / ASSESSMENT AND PLAN / ED COURSE  53 y.o. female with a history of smoking, cocaine and amphetamine abuse who presents for evaluation of cough, intermittent wheezing and shortness of breath.  Patient is extremely well-appearing and in no distress, normal vital signs, normal work of breathing, lungs are clear to auscultation with good air movement, no wheezing or crackles.  No known exposure to COVID.  No fever.  EKG showing no ischemic changes.  Chest x-ray negative for pneumonia.  Labs showing normal white count and normal troponin.  Presentation concerning for  bronchitis versus viral URI.  No indication for COVID swabbing at this time.  Patient is PERC negative. Will dc home on prednisone and albuterol, referral to [facility] for follow up.  Discussed my standard return precautions       As part of my medical decision making, I reviewed the following data within the electronic MEDICAL RECORD NUMBER Nursing notes reviewed and incorporated, Labs reviewed , EKG interpreted , Old EKG reviewed, Old chart reviewed, Radiograph reviewed , Notes from prior ED visits and [place] Controlled Substance Database    Pertinent labs & imaging results that were available during my care of the patient were reviewed by me and considered in my medical decision making (see chart for details).    ____________________________________________   FINAL CLINICAL IMPRESSION(S) / ED DIAGNOSES  Final  diagnoses:  Bronchitis      NEW MEDICATIONS STARTED DURING THIS VISIT:  ED Discharge Orders         Ordered    predniSONE (DELTASONE) 20 MG tablet  Daily     02/20/19 1353    albuterol (VENTOLIN HFA) 108 (90 Base) MCG/ACT inhaler  Every 6 hours PRN     02/20/19 1353           Note:  This document was prepared using Dragon voice recognition software and may include unintentional dictation errors.    Don Perking, [place], MD 02/20/19 [phone]

## 2019-10-29 DIAGNOSIS — F331 Major depressive disorder, recurrent, moderate: Secondary | ICD-10-CM

## 2019-10-29 DIAGNOSIS — M546 Pain in thoracic spine: Secondary | ICD-10-CM | POA: Insufficient documentation

## 2019-10-29 DIAGNOSIS — F17200 Nicotine dependence, unspecified, uncomplicated: Secondary | ICD-10-CM | POA: Insufficient documentation

## 2019-10-29 DIAGNOSIS — F4310 Post-traumatic stress disorder, unspecified: Secondary | ICD-10-CM | POA: Insufficient documentation

## 2019-10-29 DIAGNOSIS — G8929 Other chronic pain: Secondary | ICD-10-CM | POA: Insufficient documentation

## 2019-10-29 HISTORY — DX: Pain in thoracic spine: M54.6

## 2019-10-29 HISTORY — DX: Major depressive disorder, recurrent, moderate: F33.1

## 2019-11-15 ENCOUNTER — Emergency Department
Admission: EM | Admit: 2019-11-15 | Discharge: 2019-11-15 | Disposition: A | Discharge status: Home / Self Care | Payer: Self-pay | Attending: Emergency Medicine | Admitting: Emergency Medicine

## 2019-11-15 ENCOUNTER — Other Ambulatory Visit: Payer: Self-pay

## 2019-11-15 ENCOUNTER — Encounter: Payer: Self-pay | Admitting: Emergency Medicine

## 2019-11-15 ENCOUNTER — Emergency Department: Payer: Self-pay

## 2019-11-15 DIAGNOSIS — R0789 Other chest pain: Secondary | ICD-10-CM | POA: Insufficient documentation

## 2019-11-15 DIAGNOSIS — F17210 Nicotine dependence, cigarettes, uncomplicated: Secondary | ICD-10-CM | POA: Insufficient documentation

## 2019-11-15 LAB — BASIC METABOLIC PANEL
Anion gap: 12 (ref 5–15)
BUN: 15 mg/dL (ref 6–20)
CO2: 22 mmol/L (ref 22–32)
Calcium: 9 mg/dL (ref 8.9–10.3)
Chloride: 103 mmol/L (ref 98–111)
Creatinine, Ser: 0.68 mg/dL (ref 0.44–1.00)
GFR calc Af Amer: >60 mL/min (ref >60)
GFR calc non Af Amer: >60 mL/min (ref >60)
Glucose, Bld: 133 mg/dL — ABNORMAL HIGH (ref 70–99)
Potassium: 4 mmol/L (ref 3.5–5.1)
Sodium: 137 mmol/L (ref 135–145)

## 2019-11-15 LAB — CBC
HCT: 43.1 % (ref 36.0–46.0)
Hemoglobin: 14.2 g/dL (ref 12.0–15.0)
MCH: 29.6 pg (ref 26.0–34.0)
MCHC: 32.9 g/dL (ref 30.0–36.0)
MCV: 89.8 fL (ref 80.0–100.0)
Platelets: 397 10*3/uL (ref 150–400)
RBC: 4.8 MIL/uL (ref 3.87–5.11)
RDW: 13.4 % (ref 11.5–15.5)
WBC: 12.8 10*3/uL — ABNORMAL HIGH (ref 4.0–10.5)
nRBC: 0 % (ref 0.0–0.2)

## 2019-11-15 LAB — TROPONIN I (HIGH SENSITIVITY)
Troponin I (High Sensitivity): 3 ng/L (ref <18)
Troponin I (High Sensitivity): 5 ng/L (ref <18)

## 2019-11-15 MED ORDER — KETOROLAC TROMETHAMINE 30 MG/ML IJ SOLN
30.0000 mg | Freq: Once | INTRAMUSCULAR | Status: AC
  Administered 2019-11-15: 30 mg via INTRAVENOUS
  Filled 2019-11-15: qty 1

## 2019-11-15 NOTE — ED Provider Notes (Signed)
[facility] Emergency Department Provider Note   ____________________________________________    I have reviewed the triage vital signs and the nursing notes.   HISTORY  Chief Complaint Chest Pain     HPI Mary Hall is a 54 y.o. female who presents with complaints of left anterior chest discomfort that started a couple of hours ago reportedly.  She denies injury to the area.  No rash.  She describes it as an aching pain worse with movement of her left arm.  No fevers or chills.  No cough.  No nausea vomiting or diaphoresis.  No history of heart disease.  She does report that she may have high blood pressure however she has not seen a physician in several years.  Has not take anything for this.  No radiation of pain.  No calf pain or swelling, no recent travel or surgery.  She does smoke cigarettes  History reviewed. No pertinent past medical history.  Patient Active Problem List   Diagnosis Date Noted  . Psychosis (HCC) 09/16/2017  . 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 Cocaine abuse (HCC) 09/16/2017    Past Surgical History:  Procedure Laterality Date  . ABDOMINAL HYSTERECTOMY    . CHOLECYSTECTOMY      Prior to Admission medications   Medication Sig Start Date End Date Taking? Authorizing Provider  albuterol (VENTOLIN HFA) 108 (90 Base) MCG/ACT inhaler Inhale 2 puffs into the lungs every 6 (six) hours as needed for wheezing or shortness of breath. 02/20/19   Don Perking, [place], MD  famotidine (PEPCID) 40 MG tablet Take 1 tablet (40 mg total) by mouth every evening. 05/12/18 05/12/19  Phineas Semen, MD  sucralfate (CARAFATE) 1 g tablet Take 1 tablet (1 g total) by mouth 4 (four) times daily. 05/12/18   Phineas Semen, MD     Allergies Contrast media [iodinated diagnostic agents], Morphine and related, and Motrin [ibuprofen]  History reviewed. No pertinent family history.  Social History Social History   Tobacco Use  . Smoking  status: Current Every Day Smoker    Packs/day: 1.00    Types: Cigarettes  . Smokeless tobacco: Never Used  Substance Use Topics  . Alcohol use: No  . Drug use: No    Comment: HX meth    Review of Systems  Constitutional: No fever/chills Eyes: No visual changes.  ENT: No sore throat. Cardiovascular: As above Respiratory: Denies shortness of breath. Gastrointestinal: No abdominal pain.   Genitourinary: Negative for dysuria. Musculoskeletal: Negative for back pain. Skin: Negative for rash. Neurological: Negative for headaches or weakness   ____________________________________________   PHYSICAL EXAM:  VITAL SIGNS: ED Triage Vitals  Enc Vitals Group     BP 11/15/19 1846 (!) 148/81     Pulse Rate 11/15/19 1846 100     Resp 11/15/19 1846 16     Temp 11/15/19 1846 98.2 F (36.8 C)     Temp Source 11/15/19 1846 Oral     SpO2 11/15/19 1846 99 %     Weight 11/15/19 1847 88.5 kg (195 lb)     Height 11/15/19 1847 1.575 m (5\' 2" )     Head Circumference --      Peak Flow --      Pain Score 11/15/19 1847 7     Pain Loc --      Pain Edu? --      Excl. in GC? --     Constitutional: Alert and oriented.  Nose: No congestion/rhinnorhea. Mouth/Throat: Mucous membranes are moist.  Cardiovascular: Normal rate, regular rhythm. Grossly normal heart sounds.  Good peripheral circulation. Respiratory: Normal respiratory effort.  No retractions. Lungs CTAB. Gastrointestinal: Soft and nontender. No distention.   Musculoskeletal: No lower extremity tenderness nor edema.  Warm and well perfused Neurologic:  Normal speech and language. No gross focal neurologic deficits are appreciated.  Skin:  Skin is warm, dry and intact. No rash noted. Psychiatric: Mood and affect are normal. Speech and behavior are normal.  ____________________________________________   LABS (all labs ordered are listed, but only abnormal results are displayed)  Labs Reviewed  BASIC METABOLIC PANEL -  Abnormal; Notable for the following components:      Result Value   Glucose, Bld 133 (*)    All other components within normal limits  CBC - Abnormal; Notable for the following components:   WBC 12.8 (*)    All other components within normal limits  POC URINE PREG, ED  TROPONIN I (HIGH SENSITIVITY)  TROPONIN I (HIGH SENSITIVITY)   ____________________________________________  EKG  ED ECG REPORT I, Lavonia Drafts, the attending physician, personally viewed and interpreted this ECG.  Date: 11/15/2019  Rhythm: Sinus tachycardia QRS Axis: normal Intervals: normal ST/T Wave abnormalities: normal Narrative Interpretation: no evidence of acute ischemia  ____________________________________________  RADIOLOGY  Chest x-ray unremarkable ____________________________________________   PROCEDURES  Procedure(s) performed: No  Procedures   Critical Care performed: No ____________________________________________   INITIAL IMPRESSION / ASSESSMENT AND PLAN / ED COURSE  Pertinent labs & imaging results that were available during my care of the patient were reviewed by me and considered in my medical decision making (see chart for details).  Patient presents with left anterior superior chest discomfort as noted above, possibly musculoskeletal, less likely ACS given reassuring EKG, normal troponin.  HPI not consistent with dissection, nor PE.  Will trial IV analgesics, obtain second troponin and reevaluate.  Second troponin without significant change, patient's pain has completely resolved she feels well, I will discharge her at this time with close follow-up with cardiology, return precautions discussed    ____________________________________________   FINAL CLINICAL IMPRESSION(S) / ED DIAGNOSES  Final diagnoses:  Atypical chest pain        Note:  This document was prepared using Dragon voice recognition software and may include unintentional dictation errors.     Lavonia Drafts, MD 11/15/19 2210

## 2019-11-15 NOTE — ED Notes (Signed)
Pt lying in bed exhibiting no signs of acute distress but reports ongoing lt sided chest tightness (see pain rating). Pt denies any SOB sx at this time and VS are stable. MD notified of pt's pain medicine request.   Call light was placed on pts bed rail. Pt was encouraged to attempt to provide a urine sample when able.   Nothing further needed from staff.

## 2019-11-15 NOTE — ED Triage Notes (Signed)
Pt here for mid/left sided chest pain described as tight that started 1 hr ago.  Pain has been constant since starting.  Pt denies any accompanying symptoms other than nausea and has felt lightheaded at times.  VSS. No fever or cough.  Unlabored at this time.  Denies any medical problems but has not seen a doctor in long time.

## 2020-03-21 ENCOUNTER — Other Ambulatory Visit: Payer: Self-pay

## 2020-03-21 ENCOUNTER — Emergency Department: Payer: Self-pay

## 2020-03-21 ENCOUNTER — Emergency Department
Admission: EM | Admit: 2020-03-21 | Discharge: 2020-03-21 | Disposition: A | Discharge status: Home / Self Care | Payer: Self-pay | Attending: Emergency Medicine | Admitting: Emergency Medicine

## 2020-03-21 DIAGNOSIS — M79604 Pain in right leg: Secondary | ICD-10-CM | POA: Insufficient documentation

## 2020-03-21 DIAGNOSIS — T1490XA Injury, unspecified, initial encounter: Secondary | ICD-10-CM

## 2020-03-21 DIAGNOSIS — Z5321 Procedure and treatment not carried out due to patient leaving prior to being seen by health care provider: Secondary | ICD-10-CM | POA: Insufficient documentation

## 2020-03-21 NOTE — ED Notes (Signed)
Pt not in lobby and pt not outside.

## 2020-03-21 NOTE — ED Notes (Signed)
Pt pushed outside per request.

## 2020-03-21 NOTE — ED Triage Notes (Signed)
States her 80 pound dog jumped on her right foot around 2300. Pt states "my foot feels like it's gonna explode". Cms intact.

## 2020-03-21 NOTE — ED Notes (Signed)
Pt declines offer for ice.

## 2020-05-02 DIAGNOSIS — F410 Panic disorder [episodic paroxysmal anxiety] without agoraphobia: Secondary | ICD-10-CM | POA: Insufficient documentation

## 2020-05-02 DIAGNOSIS — M549 Dorsalgia, unspecified: Secondary | ICD-10-CM | POA: Insufficient documentation

## 2020-05-02 DIAGNOSIS — Z720 Tobacco use: Secondary | ICD-10-CM

## 2020-05-02 DIAGNOSIS — R0602 Shortness of breath: Secondary | ICD-10-CM

## 2020-05-02 HISTORY — DX: Tobacco use: Z72.0

## 2020-05-02 HISTORY — DX: Shortness of breath: R06.02

## 2020-05-02 HISTORY — DX: Dorsalgia, unspecified: M54.9

## 2020-10-18 IMAGING — CR DG CHEST 2V
2 series · 2 of 2 positions shown · non-contrast
Comparison: 02/20/2019

CLINICAL DATA: Chest pain

EXAM:
CHEST - 2 VIEW

[chest pa]
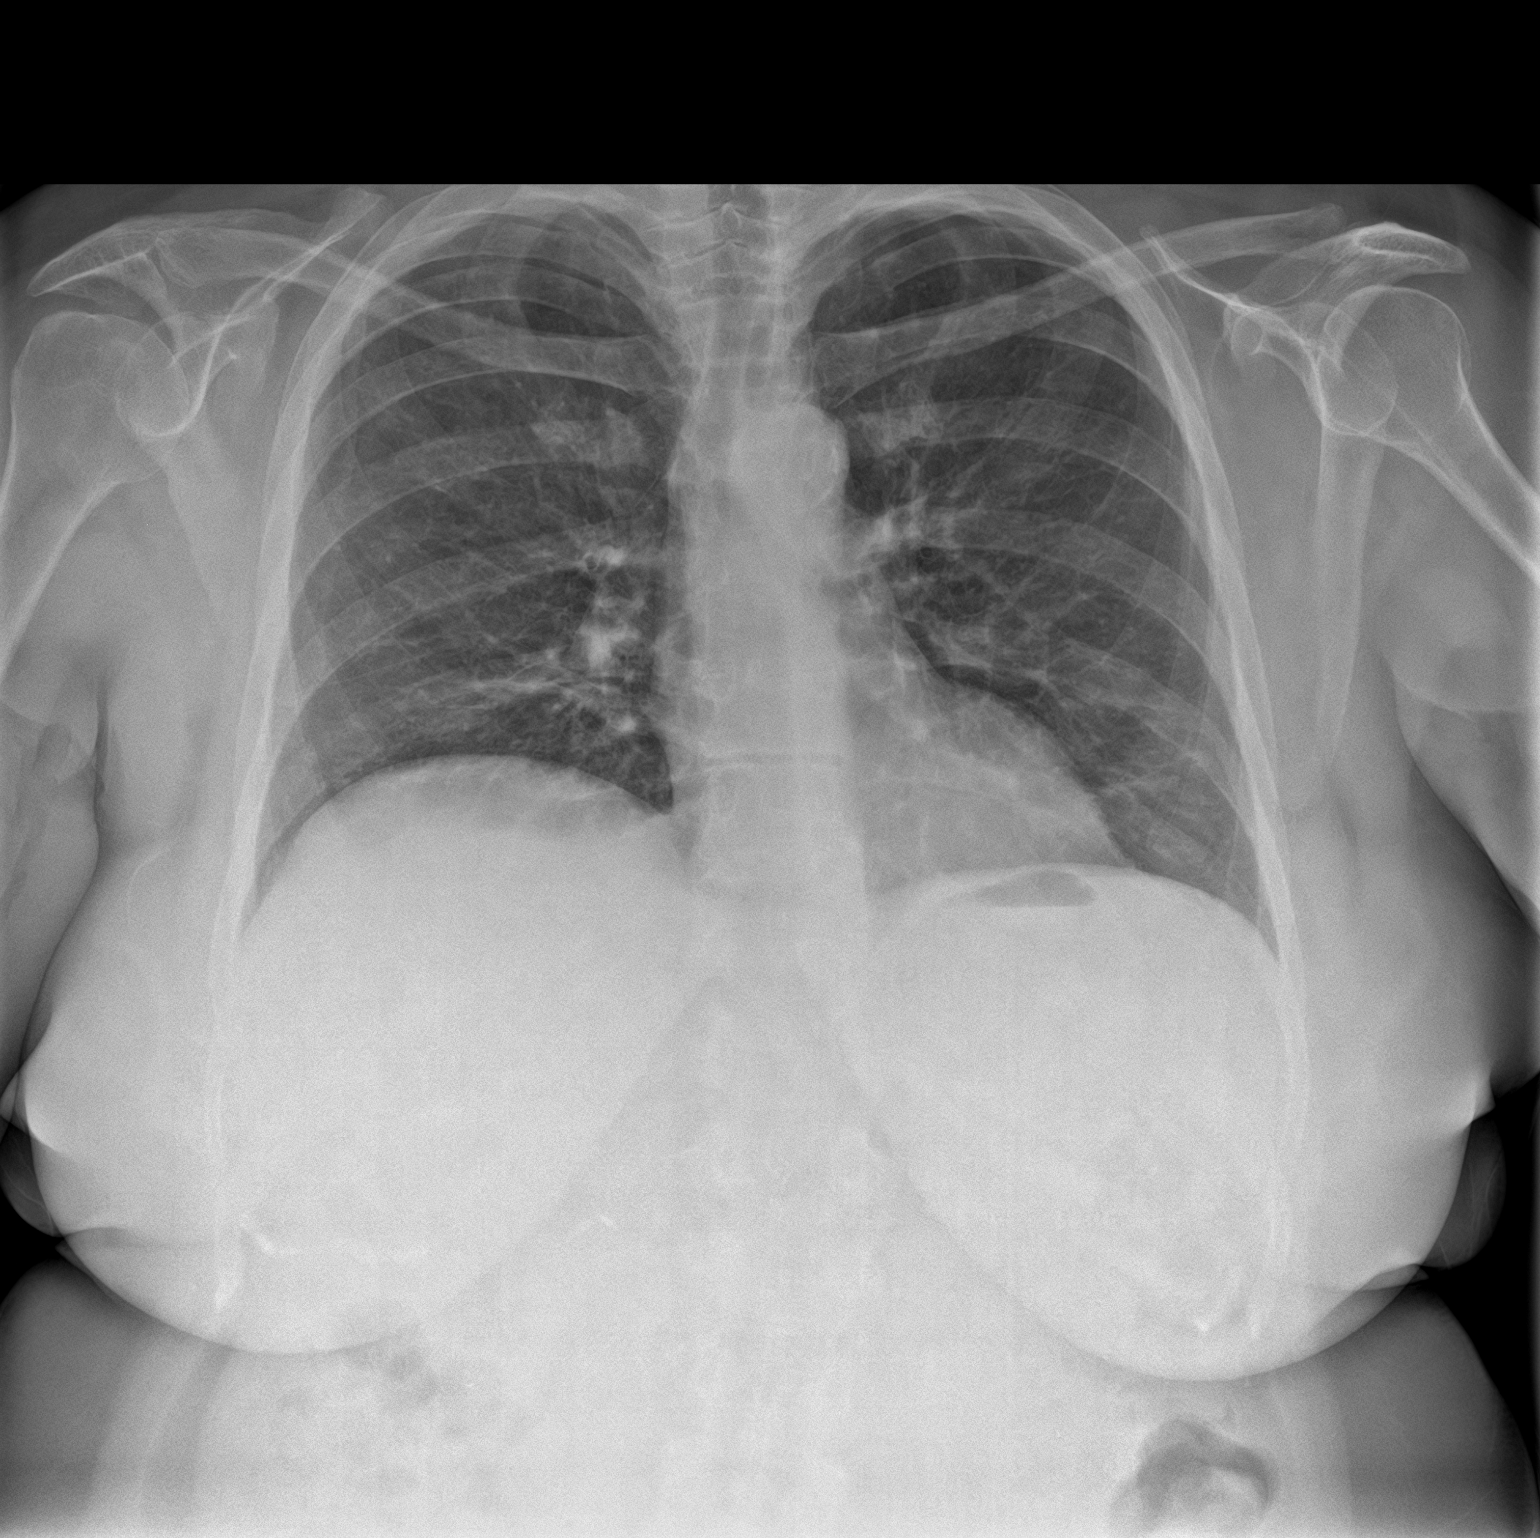

[chest lat]
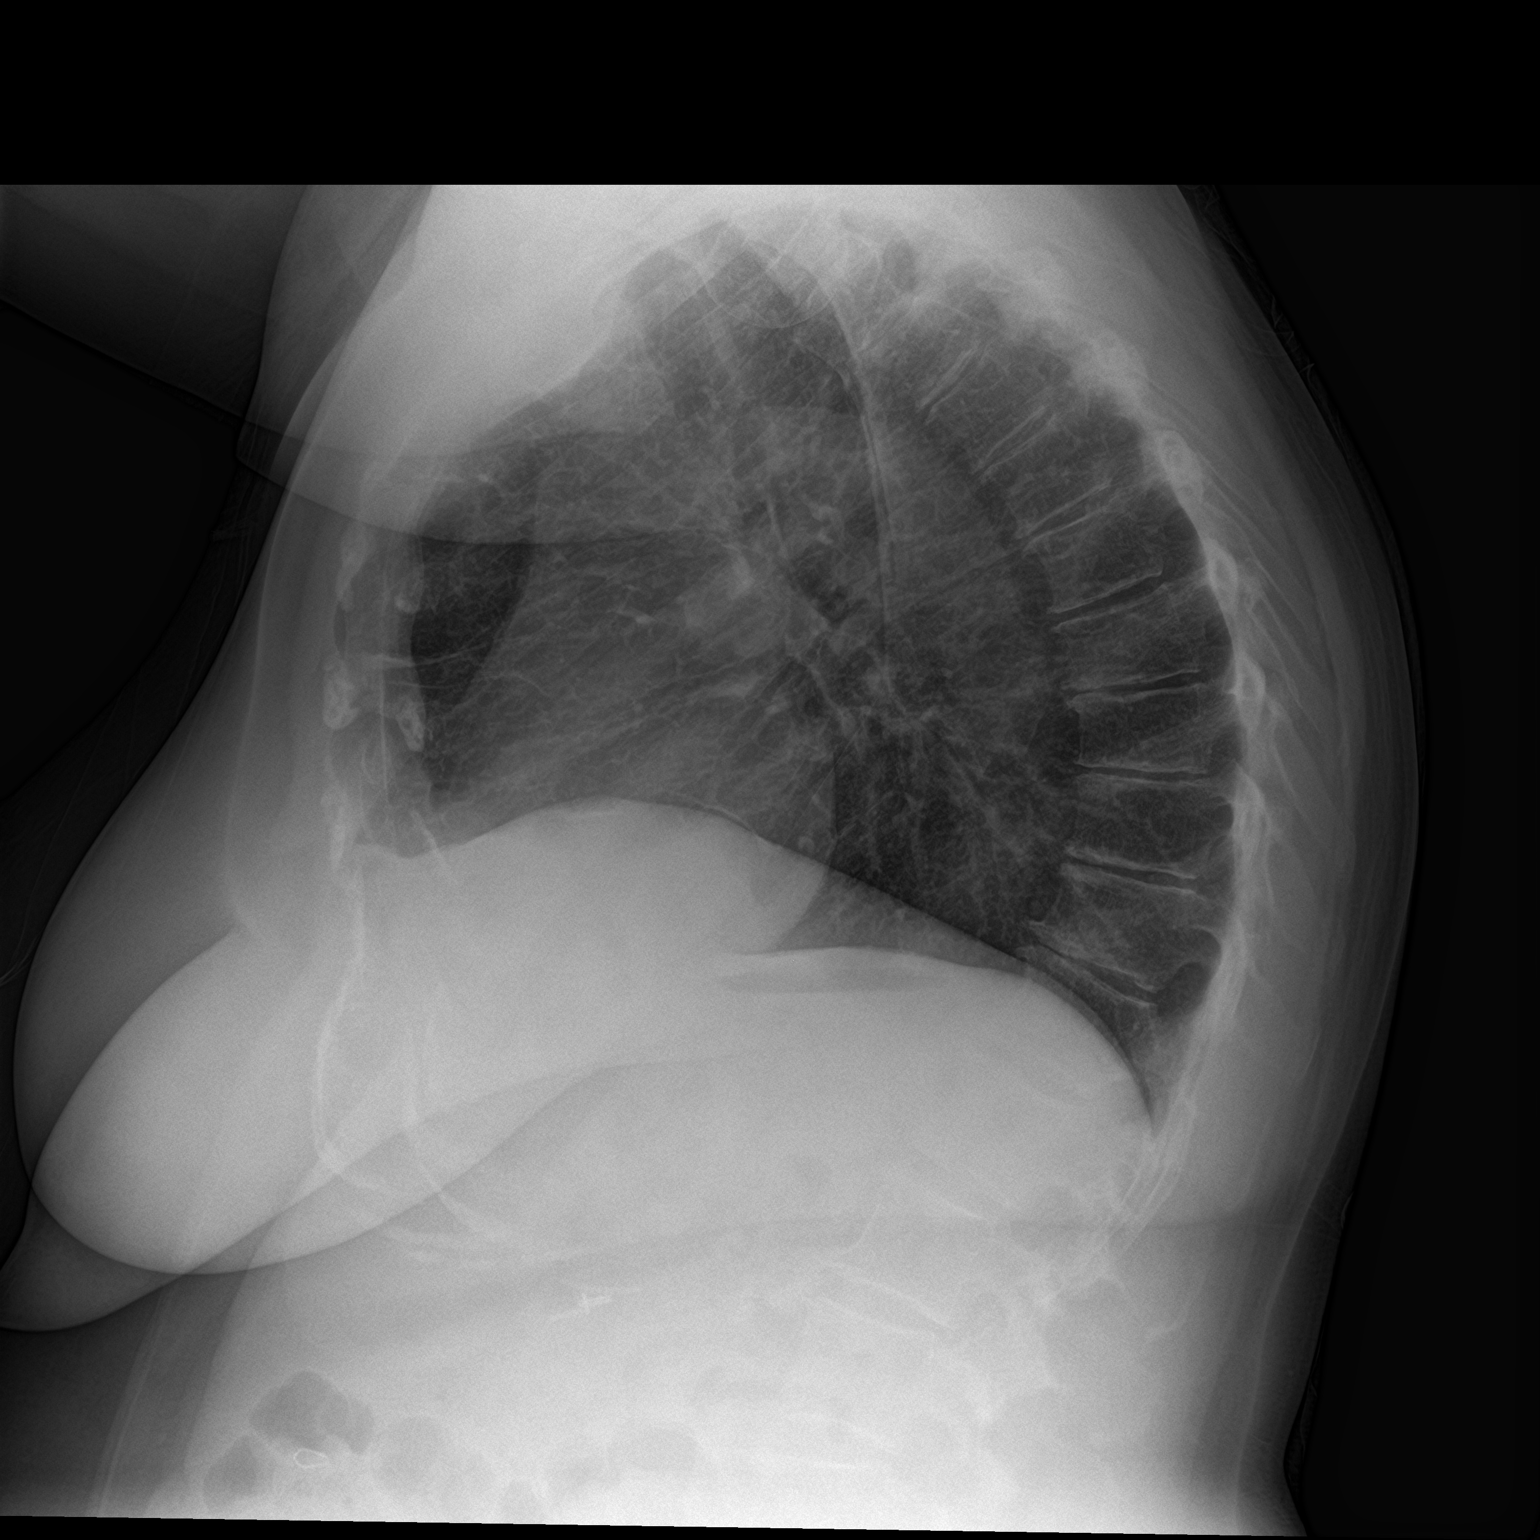

[2 of 2 positions shown; findings below may reference images not displayed]

FINDINGS: Mildly low lung volumes. Linear scarring or atelectasis left base.
No consolidation. Negative for pleural effusion. Normal heart size.
Aortic atherosclerosis. No pneumothorax.
IMPRESSION: No active cardiopulmonary disease.

## 2021-01-03 DIAGNOSIS — M419 Scoliosis, unspecified: Secondary | ICD-10-CM | POA: Insufficient documentation

## 2021-02-22 IMAGING — CR DG FOOT COMPLETE 3+V*R*
3 series · 3 of 3 positions shown · non-contrast
Comparison: None.

CLINICAL DATA: Dog jumped on foot

EXAM:
RIGHT FOOT COMPLETE - 3+ VIEW

[foot ap]
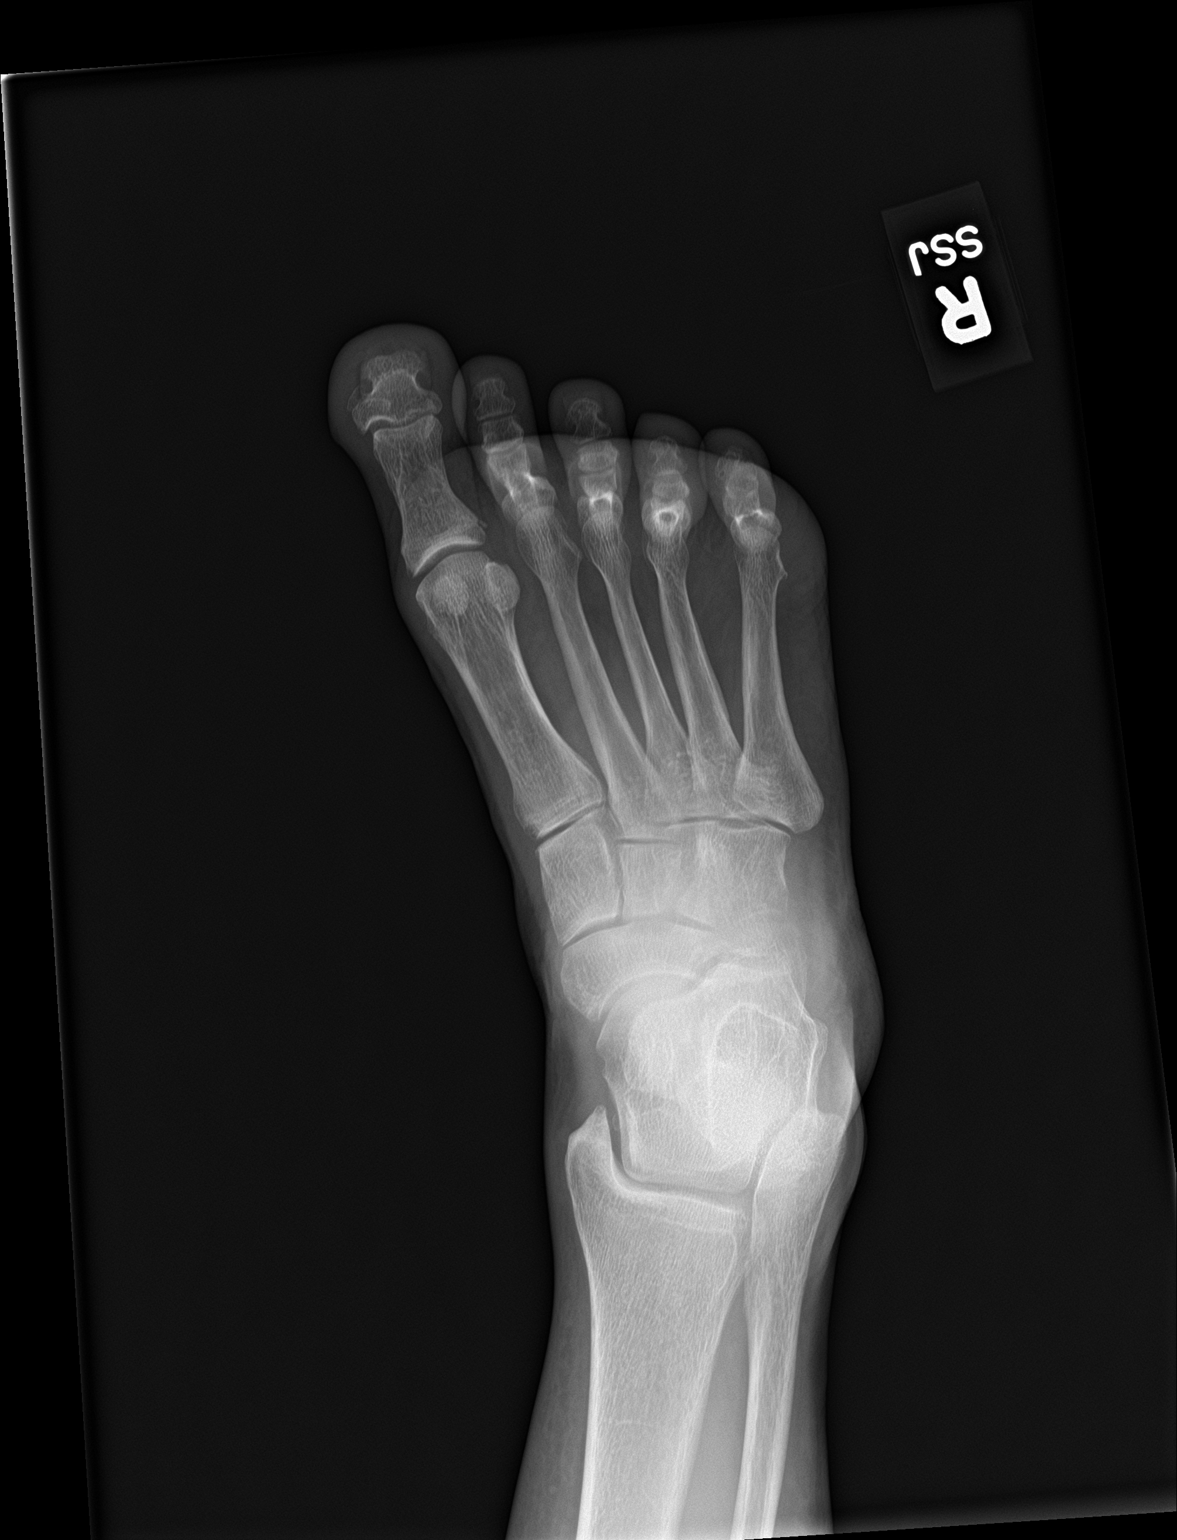

[foot obl]
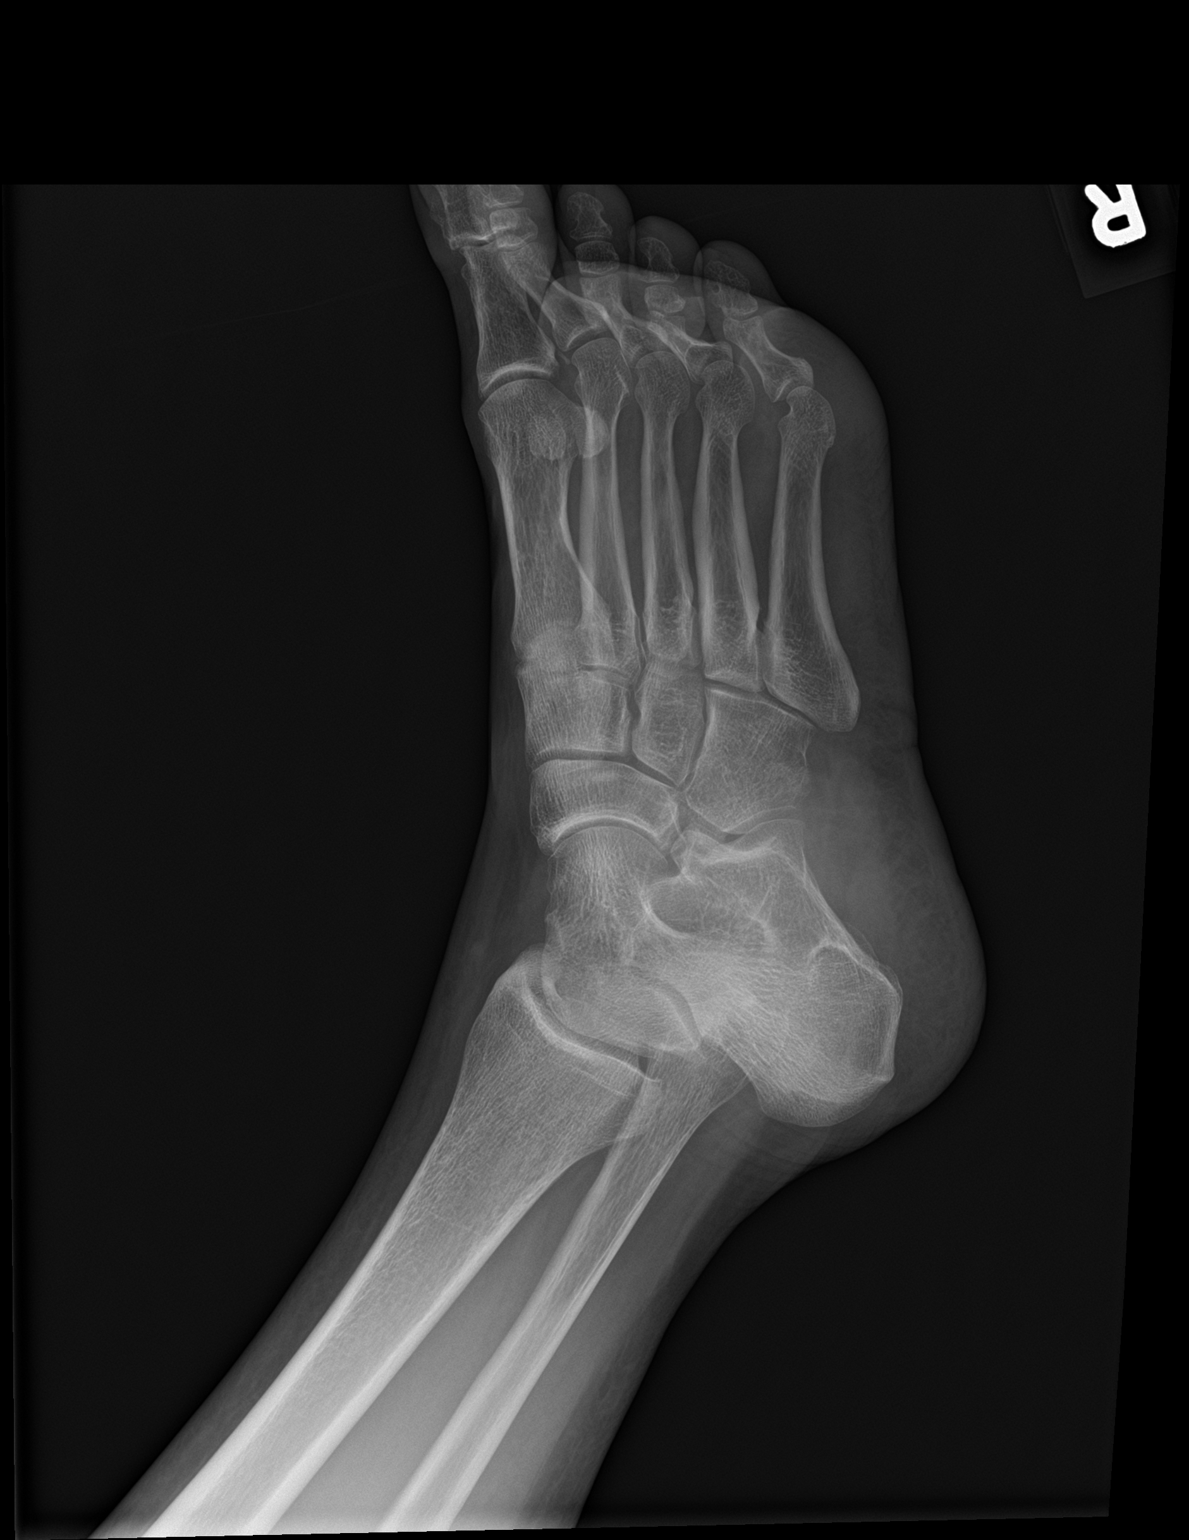

[foot lat]
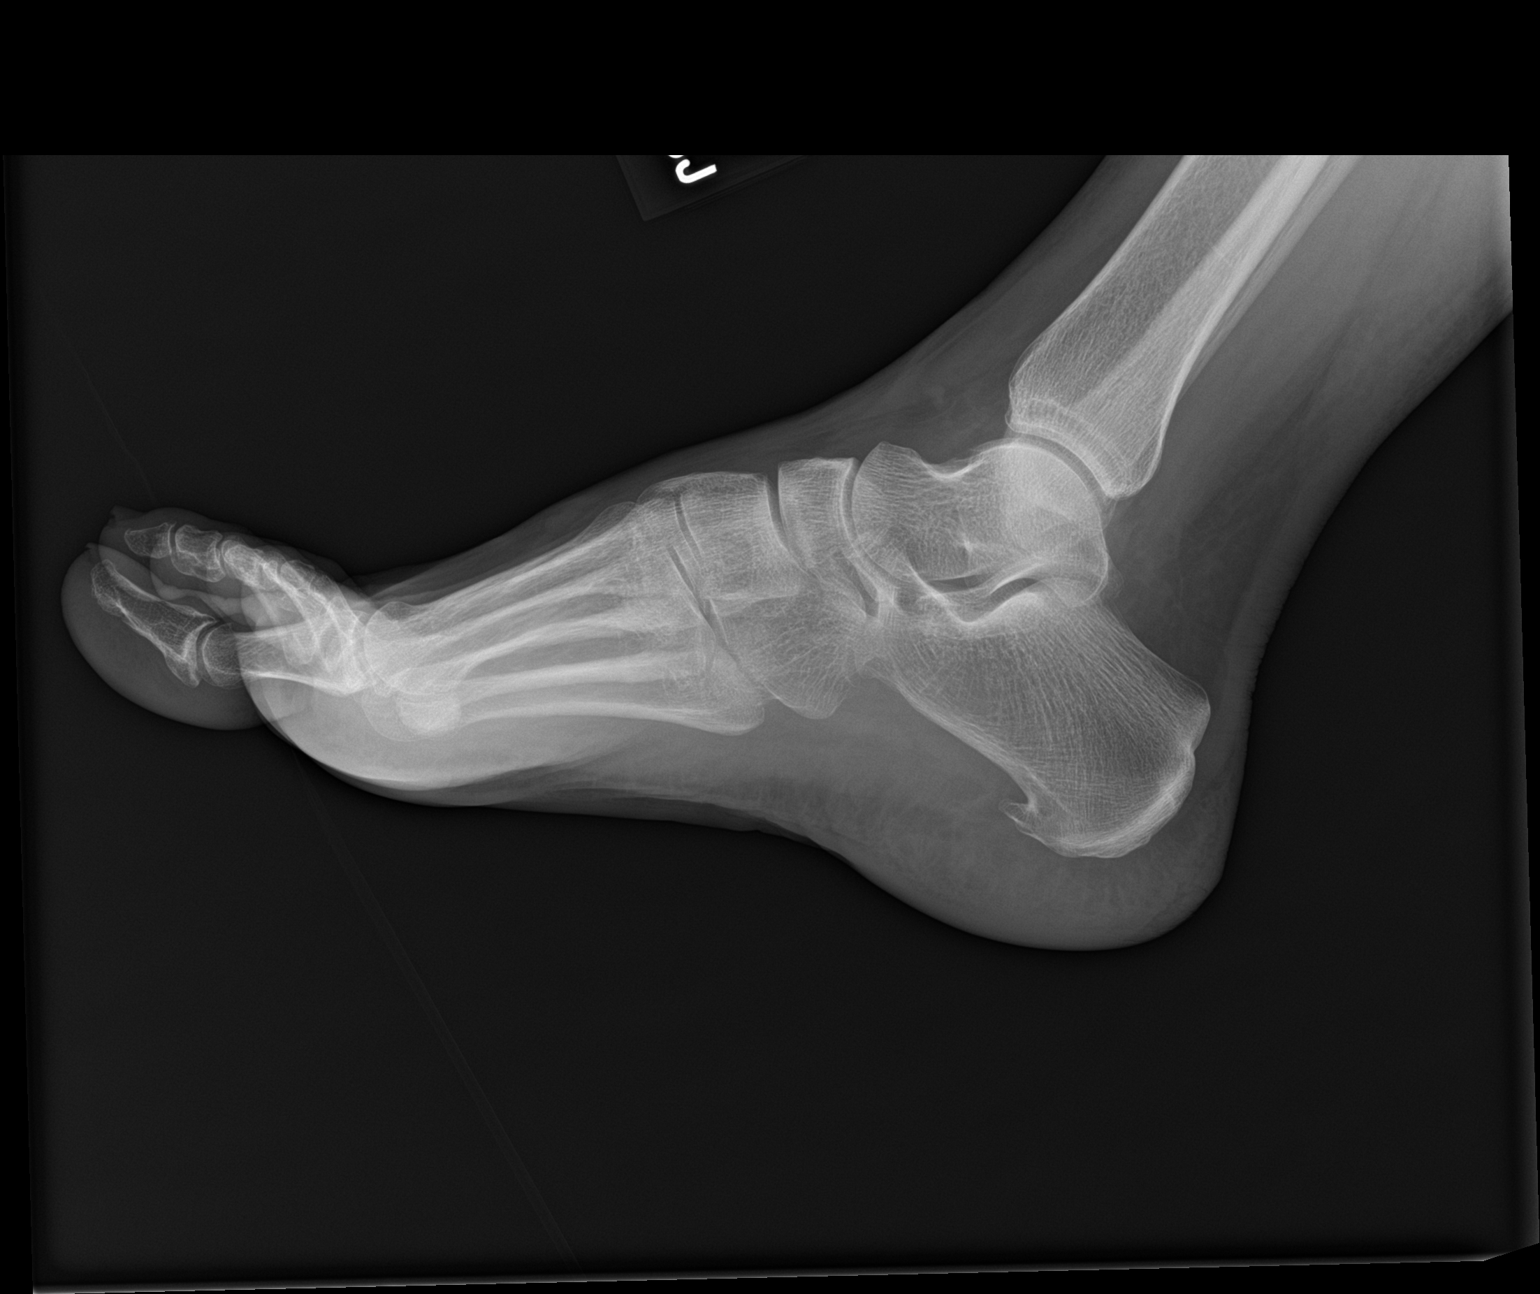

[3 of 3 positions shown; findings below may reference images not displayed]

FINDINGS: There is no evidence of fracture or dislocation. There is no
evidence of arthropathy or other focal bone abnormality. Calcaneal
enthesophyte seen on the plantar surface.
IMPRESSION: No acute osseous abnormality.

## 2021-03-28 LAB — COLOGUARD: Cologuard: NEGATIVE

## 2021-04-02 LAB — COLOGUARD: COLOGUARD: NEGATIVE

## 2023-03-27 DIAGNOSIS — E538 Deficiency of other specified B group vitamins: Secondary | ICD-10-CM | POA: Diagnosis not present

## 2023-03-27 DIAGNOSIS — F331 Major depressive disorder, recurrent, moderate: Secondary | ICD-10-CM | POA: Diagnosis not present

## 2023-04-03 ENCOUNTER — Other Ambulatory Visit: Payer: Self-pay | Admitting: Internal Medicine

## 2023-04-03 DIAGNOSIS — Z1231 Encounter for screening mammogram for malignant neoplasm of breast: Secondary | ICD-10-CM | POA: Diagnosis not present

## 2023-04-03 DIAGNOSIS — E785 Hyperlipidemia, unspecified: Secondary | ICD-10-CM | POA: Diagnosis not present

## 2023-04-03 DIAGNOSIS — Z0001 Encounter for general adult medical examination with abnormal findings: Secondary | ICD-10-CM | POA: Diagnosis not present

## 2023-04-03 DIAGNOSIS — F331 Major depressive disorder, recurrent, moderate: Secondary | ICD-10-CM | POA: Diagnosis not present

## 2023-04-03 DIAGNOSIS — I10 Essential (primary) hypertension: Secondary | ICD-10-CM | POA: Diagnosis not present

## 2023-04-03 DIAGNOSIS — Z720 Tobacco use: Secondary | ICD-10-CM | POA: Diagnosis not present

## 2023-06-04 DIAGNOSIS — M546 Pain in thoracic spine: Secondary | ICD-10-CM | POA: Diagnosis not present

## 2023-10-03 DIAGNOSIS — E785 Hyperlipidemia, unspecified: Secondary | ICD-10-CM | POA: Diagnosis not present

## 2023-10-09 DIAGNOSIS — I10 Essential (primary) hypertension: Secondary | ICD-10-CM | POA: Diagnosis not present

## 2023-10-09 DIAGNOSIS — F17210 Nicotine dependence, cigarettes, uncomplicated: Secondary | ICD-10-CM | POA: Diagnosis not present

## 2023-10-09 DIAGNOSIS — F331 Major depressive disorder, recurrent, moderate: Secondary | ICD-10-CM | POA: Diagnosis not present

## 2023-10-09 DIAGNOSIS — E785 Hyperlipidemia, unspecified: Secondary | ICD-10-CM | POA: Diagnosis not present

## 2023-11-29 ENCOUNTER — Encounter: Payer: Self-pay | Admitting: Pediatrics

## 2023-11-29 ENCOUNTER — Ambulatory Visit: Payer: Medicaid Other | Admitting: Pediatrics

## 2023-11-29 VITALS — BP 118/78 | HR 85 | Temp 98.0°F | Ht 62.0 in | Wt 180.2 lb

## 2023-11-29 DIAGNOSIS — M546 Pain in thoracic spine: Secondary | ICD-10-CM

## 2023-11-29 DIAGNOSIS — Z1330 Encounter for screening examination for mental health and behavioral disorders, unspecified: Secondary | ICD-10-CM

## 2023-11-29 DIAGNOSIS — G8929 Other chronic pain: Secondary | ICD-10-CM

## 2023-11-29 DIAGNOSIS — Z7689 Persons encountering health services in other specified circumstances: Secondary | ICD-10-CM

## 2023-11-29 DIAGNOSIS — K219 Gastro-esophageal reflux disease without esophagitis: Secondary | ICD-10-CM | POA: Diagnosis not present

## 2023-11-29 DIAGNOSIS — F411 Generalized anxiety disorder: Secondary | ICD-10-CM | POA: Diagnosis not present

## 2023-11-29 DIAGNOSIS — Z23 Encounter for immunization: Secondary | ICD-10-CM

## 2023-11-29 DIAGNOSIS — R202 Paresthesia of skin: Secondary | ICD-10-CM

## 2023-11-29 DIAGNOSIS — F331 Major depressive disorder, recurrent, moderate: Secondary | ICD-10-CM

## 2023-11-29 MED ORDER — DULOXETINE HCL 20 MG PO CPEP: 20.0000 mg | ORAL_CAPSULE | Freq: Every day | ORAL | 0 refills | Status: DC

## 2023-11-29 MED ORDER — CELECOXIB 100 MG PO CAPS
100.0000 mg | ORAL_CAPSULE | Freq: Two times a day (BID) | ORAL | 0 refills | Status: AC
Start: 2023-11-29 — End: 2023-12-13

## 2023-11-29 MED ORDER — HYDROXYZINE HCL 10 MG PO TABS: 5.0000 mg | ORAL_TABLET | Freq: Three times a day (TID) | ORAL | 0 refills | Status: DC | PRN

## 2023-11-29 NOTE — Patient Instructions (Addendum)
 [facility] [address] [place],  [place]  [address]  Celebrex for your back pain: twice daily medication  Cymbalta  20mg  daily- for anxiety and depression Hydroxyzine 10mg  as needed, if making you too sleepy, take half. Max 3 a day.  Good to meet you! Welcome to [facility]!  As your primary care doctor, I look forward to working with you to help you reach your health goals.  Please be aware of a couple of logistical items: - If you message me on mychart, it may take me 1-2 business days to get back to you. This is for non-urgent messaging.  - If you require urgent clinical attention, please call the clinic or present to urgent care/emergency room - If you have labs, I typically will send a message about them in 1-2 business days. - I am not here on Mondays, otherwise will be available from Tuesday-Friday during 8a-5pm.

## 2023-11-29 NOTE — Progress Notes (Signed)
 Establish Care Note  BP 118/78 (BP Location: Right Arm, Cuff Size: Normal)   Pulse 85   Temp 98 F (36.7 C) (Oral)   Ht 5\' 2"  (1.575 m)   Wt 180 lb 3.2 oz (81.7 kg)   SpO2 99%   BMI 32.96 kg/m    Subjective:    Patient ID: Mary Hall, female    DOB: November 16, 1965, 58 y.o.   MRN: [id number]  HPI: Mary Hall is a 57 y.o. female  Chief Complaint  Patient presents with   Establish Care    Patient went to [facility]   Back Pain    Patient states she thinks it might be arthritis more towards the mid section. Has only been using a heat pad, she does it three or four times a day   Depression    Started when her husband died Jul 01, 2023    Establishing care, the following was discussed today:  Discussed the use of AI scribe software for clinical note transcription with the patient, who gave verbal consent to proceed.  History of Present Illness   Mary Hall is a 58 year old female who presents with back pain and depression.  She has been experiencing back pain across the middle of her back, progressively worsening since 2017. The pain is associated with a visible 'hump' and a history of scoliosis diagnosed in junior high. The pain is described as a sensation similar to 'when your foot's asleep' upon touch, without radiation down her back. She has not taken prescribed steroids due to concerns about weight gain from a previous depression medication and is averse to injections due to a fear of needles. Imaging conducted three years ago revealed degenerative disc disease.  She experiences numbness in her legs when standing for extended periods, such as doing dishes or showering, which has been occurring for two to three years. This numbness is alleviated by resting her back. She also reports dizziness or balance issues when tilting her head back in the shower, present for nearly five years.  She has a history of depression, exacerbated by the recent loss of her  husband in Jun 30, 2024. She describes a lack of enjoyment in activities and a tendency to sit at home, only engaging in minimal activities such as walking her dog. She has tried nortriptyline for depression, which caused muscle cramps, and has avoided other medications due to weight gain concerns.  She has a history of anxiety, which she attributes to being in public and meeting new people. She describes a longstanding fear of driving due to difficulty judging the number of oncoming cars, which she attributes to anxiety rather than hallucinations. She also experiences a sensation that people are talking about her when she is outside, despite knowing it is not true. She reports a high heart rate attributed to anxiety.  She experiences acid reflux that sometimes causes neck pain. She previously used Pepcid, which became ineffective, and has not tried other medications like omeprazole or pantoprazole. She eats at least two meals a day and finds it difficult to predict when her acid reflux will be most severe.        Current Outpatient Medications on File Prior to Visit  Medication Sig Dispense Refill   albuterol (VENTOLIN HFA) 108 (90 Base) MCG/ACT inhaler Inhale 2 puffs into the lungs every 6 (six) hours as needed for wheezing or shortness of breath. 1 Inhaler 2   losartan (COZAAR) 25 MG tablet Take 25 mg by mouth daily.  rosuvastatin (CRESTOR) 10 MG tablet Take 10 mg by mouth 1 day or 1 dose.     famotidine (PEPCID) 40 MG tablet Take 1 tablet (40 mg total) by mouth every evening. 30 tablet 1   sucralfate (CARAFATE) 1 g tablet Take 1 tablet (1 g total) by mouth 4 (four) times daily. (Patient not taking: Reported on 11/29/2023) 60 tablet 0   No current facility-administered medications on file prior to visit.    #HM Will review HM records and updated as needed.  Relevant past medical, surgical, family and social history reviewed and updated as indicated. Interim medical history since our last  visit reviewed. Allergies and medications reviewed and updated.  ROS per HPI unless specifically indicated above     Objective:    BP 118/78 (BP Location: Right Arm, Cuff Size: Normal)   Pulse 85   Temp 98 F (36.7 C) (Oral)   Ht 5\' 2"  (1.575 m)   Wt 180 lb 3.2 oz (81.7 kg)   SpO2 99%   BMI 32.96 kg/m   Wt Readings from Last 3 Encounters:  11/29/23 180 lb 3.2 oz (81.7 kg)  03/21/20 210 lb (95.3 kg)  11/15/19 195 lb (88.5 kg)     Physical Exam Constitutional:      Appearance: Normal appearance.  HENT:     Head: Normocephalic and atraumatic.  Eyes:     Pupils: Pupils are equal, round, and reactive to light.  Cardiovascular:     Rate and Rhythm: Normal rate and regular rhythm.     Pulses: Normal pulses.     Heart sounds: Normal heart sounds.  Pulmonary:     Effort: Pulmonary effort is normal.     Breath sounds: Normal breath sounds.  Abdominal:     General: Abdomen is flat.     Palpations: Abdomen is soft.  Musculoskeletal:     Cervical back: Normal and normal range of motion.     Thoracic back: Tenderness present.     Lumbar back: Decreased range of motion. Positive right straight leg raise test and positive left straight leg raise test.  Skin:    General: Skin is warm and dry.     Capillary Refill: Capillary refill takes less than 2 seconds.  Neurological:     General: No focal deficit present.     Mental Status: She is alert. Mental status is at baseline.  Psychiatric:        Mood and Affect: Mood normal.        Behavior: Behavior normal.        Assessment & Plan:  Assessment & Plan   Chronic thoracic spine pain Paresthesia of lower extremity Assessment & Plan: Pain localized to the mid-back, possibly related to scoliosis or degenerative disc disease. Pain is worsening over time and is associated with numbness and tingling. Prior imaging showed degenerative disc disease. -Order thoracic and lumbar spine X-rays to evaluate for changes or nerve  compression. -Start Celebrex twice daily for pain relief. -Consider referral to a specialist if pain persists or worsens.  Orders: -     Celecoxib; Take 1 capsule (100 mg total) by mouth 2 (two) times daily for 14 days.  Dispense: 28 capsule; Refill: 0 -     DG Thoracic Spine W/Swimmers; Future -     DG Lumbar Spine Complete; Future  Gastroesophageal reflux disease, unspecified whether esophagitis present Assessment & Plan: Symptoms up to the neck, associated with back and neck pain. Prior trial of Pepcid was not effective. -  Start Omeprazole 20mg  before meals twice daily. -Encourage increased water intake to prevent potential constipation side effect.  Moderate episode of recurrent major depressive disorder (HCC) GAD (generalized anxiety disorder) Assessment & Plan: Recent worsening likely related to significant life stressors including the loss of her husband. Prior trials of nortriptyline and Remeron were discontinued due to side effects. Patient experiences significant anxiety in public and has difficulty distinguishing reality during high anxiety states. -Start Cymbalta for depression, anxiety, and potential benefit for chronic pain. -As needed use of Hydroxyzine for breakthrough anxiety. -Consider referral to psychiatry if symptoms do not improve with current plan.  Orders: -     DULoxetine HCl; Take 1 capsule (20 mg total) by mouth daily.  Dispense: 30 capsule; Refill: 0 -     hydrOXYzine HCl; Take 0.5-1 tablets (5-10 mg total) by mouth 3 (three) times daily as needed for anxiety.  Dispense: 30 tablet; Refill: 0  Encounter to establish care Reviewed available patient record including history, medications, problem list. HM updated as able. Will review and/or request outside records (if applicable) and will fill remaining HM gaps as needed at follow up visit.  Encounter for behavioral health screening Declined.   Need for vaccination -     Flu vaccine trivalent PF, 6mos and  older(Flulaval,Afluria,Fluarix,Fluzone) -     Tdap vaccine greater than or equal to 7yo IM   Follow up plan: Return in about 3 weeks (around 12/20/2023).  Jackolyn Confer, MD

## 2023-12-02 ENCOUNTER — Ambulatory Visit
Admission: RE | Admit: 2023-12-02 | Discharge: 2023-12-02 | Disposition: A | Discharge status: Home / Self Care | Source: Ambulatory Visit | Attending: Pediatrics | Admitting: Pediatrics

## 2023-12-02 ENCOUNTER — Ambulatory Visit
Admission: RE | Admit: 2023-12-02 | Discharge: 2023-12-02 | Disposition: A | Discharge status: Home / Self Care | Attending: Pediatrics | Admitting: Pediatrics

## 2023-12-02 DIAGNOSIS — R202 Paresthesia of skin: Secondary | ICD-10-CM | POA: Insufficient documentation

## 2023-12-02 DIAGNOSIS — G8929 Other chronic pain: Secondary | ICD-10-CM | POA: Diagnosis not present

## 2023-12-02 DIAGNOSIS — M549 Dorsalgia, unspecified: Secondary | ICD-10-CM | POA: Diagnosis not present

## 2023-12-02 DIAGNOSIS — M51369 Other intervertebral disc degeneration, lumbar region without mention of lumbar back pain or lower extremity pain: Secondary | ICD-10-CM | POA: Diagnosis not present

## 2023-12-02 DIAGNOSIS — M47814 Spondylosis without myelopathy or radiculopathy, thoracic region: Secondary | ICD-10-CM | POA: Diagnosis not present

## 2023-12-02 DIAGNOSIS — M40204 Unspecified kyphosis, thoracic region: Secondary | ICD-10-CM | POA: Diagnosis not present

## 2023-12-02 DIAGNOSIS — M47816 Spondylosis without myelopathy or radiculopathy, lumbar region: Secondary | ICD-10-CM | POA: Diagnosis not present

## 2023-12-02 DIAGNOSIS — M48061 Spinal stenosis, lumbar region without neurogenic claudication: Secondary | ICD-10-CM | POA: Diagnosis not present

## 2023-12-02 DIAGNOSIS — M546 Pain in thoracic spine: Secondary | ICD-10-CM | POA: Diagnosis not present

## 2023-12-03 ENCOUNTER — Other Ambulatory Visit: Payer: Self-pay | Admitting: Pediatrics

## 2023-12-03 DIAGNOSIS — F331 Major depressive disorder, recurrent, moderate: Secondary | ICD-10-CM

## 2023-12-04 ENCOUNTER — Encounter: Payer: Self-pay | Admitting: Pediatrics

## 2023-12-04 DIAGNOSIS — F411 Generalized anxiety disorder: Secondary | ICD-10-CM | POA: Diagnosis not present

## 2023-12-04 DIAGNOSIS — M546 Pain in thoracic spine: Secondary | ICD-10-CM | POA: Diagnosis not present

## 2023-12-04 DIAGNOSIS — Z23 Encounter for immunization: Secondary | ICD-10-CM | POA: Diagnosis not present

## 2023-12-04 DIAGNOSIS — K219 Gastro-esophageal reflux disease without esophagitis: Secondary | ICD-10-CM | POA: Diagnosis not present

## 2023-12-04 DIAGNOSIS — G8929 Other chronic pain: Secondary | ICD-10-CM | POA: Diagnosis not present

## 2023-12-04 DIAGNOSIS — R202 Paresthesia of skin: Secondary | ICD-10-CM | POA: Diagnosis not present

## 2023-12-04 DIAGNOSIS — Z1330 Encounter for screening examination for mental health and behavioral disorders, unspecified: Secondary | ICD-10-CM | POA: Diagnosis not present

## 2023-12-04 DIAGNOSIS — Z7689 Persons encountering health services in other specified circumstances: Secondary | ICD-10-CM | POA: Diagnosis not present

## 2023-12-04 DIAGNOSIS — F331 Major depressive disorder, recurrent, moderate: Secondary | ICD-10-CM | POA: Diagnosis not present

## 2023-12-04 NOTE — Assessment & Plan Note (Signed)
 Pain localized to the mid-back, possibly related to scoliosis or degenerative disc disease. Pain is worsening over time and is associated with numbness and tingling. Prior imaging showed degenerative disc disease. -Order thoracic and lumbar spine X-rays to evaluate for changes or nerve compression. -Start Celebrex twice daily for pain relief. -Consider referral to a specialist if pain persists or worsens.

## 2023-12-04 NOTE — Telephone Encounter (Signed)
 Request too soon last ordered 11/29/23, #30, 0 refills Requested Prescriptions  Pending Prescriptions Disp Refills   DULoxetine (CYMBALTA) 20 MG capsule [Pharmacy Med Name: DULOXETINE HCL 20 MG CAP] 30 capsule 0    Sig: TAKE 1 CAPSULE BY MOUTH ONCE DAILY     Psychiatry: Antidepressants - SNRI - duloxetine Failed - 12/04/2023 12:14 PM      Failed - Cr in normal range and within 360 days    Creatinine  Date Value Ref Range Status  02/13/2012 0.55 (L) 0.60 - 1.30 mg/dL Final   Creatinine, Ser  Date Value Ref Range Status  11/15/2019 0.68 0.44 - 1.00 mg/dL Final         Failed - eGFR is 30 or above and within 360 days    EGFR (African American)  Date Value Ref Range Status  02/13/2012 >60  Final   GFR calc Af Amer  Date Value Ref Range Status  11/15/2019 >60 >60 mL/min Final   EGFR (Non-African Amer.)  Date Value Ref Range Status  02/13/2012 >60  Final    Comment:    eGFR values <19mL/min/1.73 m2 may be an indication of chronic kidney disease (CKD). Calculated eGFR is useful in patients with stable renal function. The eGFR calculation will not be reliable in acutely ill patients when serum creatinine is changing rapidly. It is not useful in  patients on dialysis. The eGFR calculation may not be applicable to patients at the low and high extremes of body sizes, pregnant women, and vegetarians.    GFR calc non Af Amer  Date Value Ref Range Status  11/15/2019 >60 >60 mL/min Final         Failed - Completed PHQ-2 or PHQ-9 in the last 360 days      Failed - Valid encounter within last 6 months    Recent Outpatient Visits   None     Future Appointments             In 2 weeks Jackolyn Confer, MD [facility]            Passed - Last BP in normal range    BP Readings from Last 1 Encounters:  11/29/23 118/78

## 2023-12-04 NOTE — Assessment & Plan Note (Signed)
 Symptoms up to the neck, associated with back and neck pain. Prior trial of Pepcid was not effective. -Start Omeprazole 20mg  before meals twice daily. -Encourage increased water intake to prevent potential constipation side effect.

## 2023-12-04 NOTE — Assessment & Plan Note (Signed)
 Recent worsening likely related to significant life stressors including the loss of her husband. Prior trials of nortriptyline and Remeron were discontinued due to side effects. Patient experiences significant anxiety in public and has difficulty distinguishing reality during high anxiety states. -Start Cymbalta for depression, anxiety, and potential benefit for chronic pain. -As needed use of Hydroxyzine for breakthrough anxiety. -Consider referral to psychiatry if symptoms do not improve with current plan.

## 2023-12-06 ENCOUNTER — Other Ambulatory Visit: Payer: Self-pay | Admitting: Pediatrics

## 2023-12-06 NOTE — Telephone Encounter (Signed)
 Copied from CRM [id number]. Topic: Clinical - Medication Refill >> Dec 06, 2023 11:59 AM Alessandra Bevels wrote: Most Recent Primary Care Visit:  Provider: Jackolyn Confer  Department: [facility]  Visit Type: NEW PATIENT  Date: 11/29/2023  Medication: sucralfate (CARAFATE) 1 g tablet [[id number]]  Patient discussed this with provider at new patient appt  Has the patient contacted their pharmacy? Yes (Agent: If no, request that the patient contact the pharmacy for the refill. If patient does not wish to contact the pharmacy document the reason why and proceed with request.) (Agent: If yes, when and what did the pharmacy advise?)  Is this the correct pharmacy for this prescription? Yes If no, delete pharmacy and type the correct one.  This is the patient's preferred pharmacy:  [facility] - [place], [place] - [address] [place] [address] Phone: [phone] Fax: [phone]   Has the prescription been filled recently? Yes  Is the patient out of the medication? Yes  Has the patient been seen for an appointment in the last year OR does the patient have an upcoming appointment? Yes  Can we respond through MyChart? Yes  Agent: Please be advised that Rx refills may take up to 3 business days. We ask that you follow-up with your pharmacy.

## 2023-12-09 NOTE — Telephone Encounter (Signed)
 Requested medication (s) are due for refill today:   Requested medication (s) are on the active medication list: Yes  Last refill:    Future visit scheduled:   Notes to clinic:  See request.    Requested Prescriptions  Pending Prescriptions Disp Refills   sucralfate (CARAFATE) 1 g tablet 60 tablet 0    Sig: Take 1 tablet (1 g total) by mouth 4 (four) times daily.     There is no refill protocol information for this order

## 2023-12-24 ENCOUNTER — Ambulatory Visit: Payer: Medicaid Other | Admitting: Pediatrics

## 2023-12-27 ENCOUNTER — Ambulatory Visit: Admitting: Pediatrics

## 2023-12-27 VITALS — BP 154/119 | HR 93 | Temp 98.0°F | Wt 182.4 lb

## 2023-12-27 DIAGNOSIS — K219 Gastro-esophageal reflux disease without esophagitis: Secondary | ICD-10-CM | POA: Diagnosis not present

## 2023-12-27 DIAGNOSIS — R197 Diarrhea, unspecified: Secondary | ICD-10-CM

## 2023-12-27 DIAGNOSIS — J4540 Moderate persistent asthma, uncomplicated: Secondary | ICD-10-CM | POA: Diagnosis not present

## 2023-12-27 DIAGNOSIS — I10 Essential (primary) hypertension: Secondary | ICD-10-CM

## 2023-12-27 DIAGNOSIS — M546 Pain in thoracic spine: Secondary | ICD-10-CM | POA: Diagnosis not present

## 2023-12-27 DIAGNOSIS — F4310 Post-traumatic stress disorder, unspecified: Secondary | ICD-10-CM

## 2023-12-27 DIAGNOSIS — F331 Major depressive disorder, recurrent, moderate: Secondary | ICD-10-CM

## 2023-12-27 DIAGNOSIS — F411 Generalized anxiety disorder: Secondary | ICD-10-CM

## 2023-12-27 DIAGNOSIS — F17200 Nicotine dependence, unspecified, uncomplicated: Secondary | ICD-10-CM

## 2023-12-27 DIAGNOSIS — G8929 Other chronic pain: Secondary | ICD-10-CM | POA: Diagnosis not present

## 2023-12-27 MED ORDER — ALBUTEROL SULFATE HFA 108 (90 BASE) MCG/ACT IN AERS: 2.0000 | INHALATION_SPRAY | Freq: Four times a day (QID) | RESPIRATORY_TRACT | 2 refills | Status: DC | PRN

## 2023-12-27 MED ORDER — OMEPRAZOLE 40 MG PO CPDR: 40.0000 mg | DELAYED_RELEASE_CAPSULE | Freq: Every day | ORAL | 3 refills | Status: DC
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
## 2023-12-27 NOTE — Patient Instructions (Signed)
 Stop cymbalta Try just half of hydroxyzine  Start taking celebrex  Start omeprazole 40mg , take 15-20 minutes before meals

## 2024-01-01 ENCOUNTER — Other Ambulatory Visit: Payer: Self-pay | Admitting: Pediatrics

## 2024-01-01 DIAGNOSIS — F331 Major depressive disorder, recurrent, moderate: Secondary | ICD-10-CM

## 2024-01-01 NOTE — Telephone Encounter (Signed)
 Copied from CRM [id number]. Topic: Clinical - Medication Refill >> Jan 01, 2024 10:05 AM Izetta Dakin wrote: Most Recent Primary Care Visit:  Provider: Jackolyn Confer  Department: [facility]  Visit Type: OFFICE VISIT  Date: 12/27/2023  Medication: DULoxetine (CYMBALTA) 20 MG capsule  Has the patient contacted their pharmacy? Yes (Agent: If no, request that the patient contact the pharmacy for the refill. If patient does not wish to contact the pharmacy document the reason why and proceed with request.) (Agent: If yes, when and what did the pharmacy advise?)  Is this the correct pharmacy for this prescription? Yes If no, delete pharmacy and type the correct one.  This is the patient's preferred pharmacy:    [facility] - [place], [place] - [address] [place] [address] Phone: [phone] Fax: [phone]   Has the prescription been filled recently? No  Is the patient out of the medication? Yes  Has the patient been seen for an appointment in the last year OR does the patient have an upcoming appointment? Yes  Can we respond through MyChart? Yes  Agent: Please be advised that Rx refills may take up to 3 business days. We ask that you follow-up with your pharmacy.

## 2024-01-02 ENCOUNTER — Encounter: Payer: Self-pay | Admitting: Pediatrics

## 2024-01-02 DIAGNOSIS — J45909 Unspecified asthma, uncomplicated: Secondary | ICD-10-CM | POA: Insufficient documentation

## 2024-01-02 NOTE — Telephone Encounter (Signed)
 Patient not taking: Reported on 12/27/2023  Requested Prescriptions  Pending Prescriptions Disp Refills   DULoxetine (CYMBALTA) 20 MG capsule 30 capsule 0    Sig: Take 1 capsule (20 mg total) by mouth daily.     Psychiatry: Antidepressants - SNRI - duloxetine Failed - 01/02/2024  3:41 PM      Failed - Cr in normal range and within 360 days    Creatinine  Date Value Ref Range Status  02/13/2012 0.55 (L) 0.60 - 1.30 mg/dL Final   Creatinine, Ser  Date Value Ref Range Status  11/15/2019 0.68 0.44 - 1.00 mg/dL Final         Failed - eGFR is 30 or above and within 360 days    EGFR (African American)  Date Value Ref Range Status  02/13/2012 >60  Final   GFR calc Af Amer  Date Value Ref Range Status  11/15/2019 >60 >60 mL/min Final   EGFR (Non-African Amer.)  Date Value Ref Range Status  02/13/2012 >60  Final    Comment:    eGFR values <37mL/min/1.73 m2 may be an indication of chronic kidney disease (CKD). Calculated eGFR is useful in patients with stable renal function. The eGFR calculation will not be reliable in acutely ill patients when serum creatinine is changing rapidly. It is not useful in  patients on dialysis. The eGFR calculation may not be applicable to patients at the low and high extremes of body sizes, pregnant women, and vegetarians.    GFR calc non Af Amer  Date Value Ref Range Status  11/15/2019 >60 >60 mL/min Final         Failed - Last BP in normal range    BP Readings from Last 1 Encounters:  12/27/23 (!) 154/119         Passed - Completed PHQ-2 or PHQ-9 in the last 360 days      Passed - Valid encounter within last 6 months    Recent Outpatient Visits           6 days ago Posttraumatic stress disorder   [facility] Jackolyn Confer, MD   1 month ago Chronic thoracic spine pain   [place] [facility] Jackolyn Confer, MD

## 2024-01-02 NOTE — Assessment & Plan Note (Signed)
 Expressed interest in quitting smoking. Previous attempts with patches unsuccessful due to headaches. Chantix considered but may be delayed due to potential side effects. - Discuss smoking cessation options, including Chantix, at next visit.

## 2024-01-02 NOTE — Assessment & Plan Note (Signed)
 Anxiety and possible PTSD symptoms noted. Hydroxyzine caused vivid dreams. Genetic testing planned to optimize treatment. Hesitant about new medications due to past experiences. - Perform genetic swab test for psychiatric medication response. - Consider half-dose of hydroxyzine if needed. - Discuss potential for therapy and support groups.

## 2024-01-02 NOTE — Assessment & Plan Note (Signed)
 Chronic back pain managed with physical therapy and anti-inflammatory medication. Home exercises preferred due to transportation challenges. Celebrex considered for pain management. - Provide home exercises for back pain. - Consider home physical therapy if transportation remains an issue. - Reintroduce Celebrex for pain management.

## 2024-01-02 NOTE — Assessment & Plan Note (Signed)
 Persistent acid reflux not controlled with Pepcid. Omeprazole preferred for long-term management. Issues with pharmacy receiving prescription noted. - Prescribe omeprazole 40 mg twice daily. - Advise to take omeprazole 15-20 minutes before meals. - Keep Pepcid for short-term relief. - Send prescription to [facility].

## 2024-01-02 NOTE — Assessment & Plan Note (Signed)
 Hypertension well-managed with current medication. Elevated today due to nervousness. Will check at home and make adjustments if needed. - Continue current blood pressure medication. - Monitor blood pressure regularly.

## 2024-01-02 NOTE — Assessment & Plan Note (Signed)
 Uses albuterol inhaler but not formally diagnosed with COPD. Breathing difficulties noted, especially with pollen exposure. Smoking history present. - Refill albuterol inhaler. - Plan for formal COPD diagnosis with a breathing test at a future visit. - Discuss potential for better maintenance inhalers if COPD is confirmed.

## 2024-01-14 ENCOUNTER — Encounter: Payer: Self-pay | Admitting: Pediatrics

## 2024-01-14 ENCOUNTER — Telehealth (INDEPENDENT_AMBULATORY_CARE_PROVIDER_SITE_OTHER): Admitting: Pediatrics

## 2024-01-14 DIAGNOSIS — F411 Generalized anxiety disorder: Secondary | ICD-10-CM

## 2024-01-14 DIAGNOSIS — F410 Panic disorder [episodic paroxysmal anxiety] without agoraphobia: Secondary | ICD-10-CM | POA: Diagnosis not present

## 2024-01-14 DIAGNOSIS — M546 Pain in thoracic spine: Secondary | ICD-10-CM | POA: Diagnosis not present

## 2024-01-14 DIAGNOSIS — K219 Gastro-esophageal reflux disease without esophagitis: Secondary | ICD-10-CM | POA: Diagnosis not present

## 2024-01-14 DIAGNOSIS — G8929 Other chronic pain: Secondary | ICD-10-CM | POA: Diagnosis not present

## 2024-01-14 MED ORDER — DESVENLAFAXINE SUCCINATE ER 25 MG PO TB24: 25.0000 mg | ORAL_TABLET | Freq: Every day | ORAL | 2 refills | Status: DC

## 2024-01-14 MED ORDER — BUSPIRONE HCL 5 MG PO TABS: 5.0000 mg | ORAL_TABLET | Freq: Two times a day (BID) | ORAL | 0 refills | Status: DC | PRN

## 2024-01-14 NOTE — Assessment & Plan Note (Signed)
 Significant morning stiffness. Celebrex not started due to side effect concerns. Physical therapy challenging. Muscle relaxant deferred to avoid polypharmacy. - Start Celebrex for pain management. - Encourage continuation of physical therapy exercises. - Consider muscle relaxant if Celebrex is ineffective.

## 2024-01-14 NOTE — Assessment & Plan Note (Signed)
 Symptoms well-managed with once-daily omeprazole.

## 2024-01-14 NOTE — Progress Notes (Signed)
 Telehealth Visit  I connected with  Mary Hall on 01/14/24 by a video enabled telemedicine application and verified that I am speaking with the correct person using two identifiers.   I discussed the limitations of evaluation and management by telemedicine. The patient expressed understanding and agreed to proceed.  Subjective:    Patient ID: Mary Hall, female    DOB: 1966-01-25, 58 y.o.   MRN: [id number]  HPI: Mary Hall is a 58 y.o. female  Chief Complaint  Patient presents with   Anxiety    Discussed the use of AI scribe software for clinical note transcription with the patient, who gave verbal consent to proceed.  History of Present Illness   Mary Hall is a 58 year old female who presents for follow-up of back pain and medication management for anxiety and PTSD.  She describes her back as 'really, really stiff' upon waking up. She has not started taking Celebrex, which was previously prescribed, due to concerns about side effects. She has attempted physical therapy exercises a few times but finds them challenging.  Regarding her mental health, she is currently taking Cymbalta for PTSD and anxiety. She has experienced adverse reactions to many medications, as revealed by a recent swab test. Hydroxyzine helps with anxiety but causes significant sedation, leading her to sleep. She takes the full dose due to the small size of the pills. Her husband has also taken hydroxyzine, experiencing prolonged sedation. She is sensitive to medications, which has impacted her response to treatments in the past.  She reports significant improvement in her acid reflux symptoms, stating it is 'a hundred percent better' with the use of omeprazole once daily.  She struggles with technology-related tasks but is more calm at home.      Relevant past medical, surgical, family and social history reviewed and updated as indicated. Interim medical history since our last visit  reviewed. Allergies and medications reviewed and updated.  ROS per HPI unless specifically indicated above     Objective:     Physical Exam Constitutional:      General: She is not in acute distress.    Appearance: Normal appearance.  Neurological:     General: No focal deficit present.     Mental Status: She is alert. Mental status is at baseline.      LIMITED EXAM GIVEN VIDEO VISIT     Assessment & Plan:  Assessment & Plan   GAD (generalized anxiety disorder) Panic attacks Assessment & Plan: Anxiety and PTSD with genetic sensitivity to many medications. Pristiq and Buspar identified as suitable. Hydroxyzine causes significant sedation. - Start Pristiq 25 mg in the morning. - Prescribe Buspar as needed, up to twice daily. - Monitor and adjust treatment based on response.  Orders: -     busPIRone HCl; Take 1 tablet (5 mg total) by mouth 2 (two) times daily as needed.  Dispense: 60 tablet; Refill: 0 -     Desvenlafaxine Succinate ER; Take 1 tablet (25 mg total) by mouth daily.  Dispense: 30 tablet; Refill: 2  Gastroesophageal reflux disease without esophagitis Assessment & Plan: Symptoms well-managed with once-daily omeprazole.   Chronic thoracic spine pain Assessment & Plan: Significant morning stiffness. Celebrex not started due to side effect concerns. Physical therapy challenging. Muscle relaxant deferred to avoid polypharmacy. - Start Celebrex for pain management. - Encourage continuation of physical therapy exercises. - Consider muscle relaxant if Celebrex is ineffective.   Follow up plan: Return in about 4 weeks (around 02/11/2024) for Mood.  Byrd Hesselbach  Mary Most, MD   This visit was completed via video visit through MyChart due to the restrictions of the COVID-19 pandemic. All issues as above were discussed and addressed. Physical exam was done as above through visual confirmation on video through MyChart. If it was felt that the patient should be evaluated in  the office, they were directed there. The patient verbally consented to this visit.  Location of the patient: home Location of the provider: work Those involved with this call:  Provider: Geraldine Kling, MD CMA:  Mancil Seat, CMA Time spent on call:  15 minutes with patient face to face via video conference. More than 50% of this time was spent in counseling and coordination of care. 15 minutes total spent in review of patient's record and preparation of their chart. Total time spent on this encounter: 30 minutes.

## 2024-01-14 NOTE — Patient Instructions (Addendum)
 Plan:  We are starting you on Pristiq (desvenlafaxine) 25 mg daily in the morning and prescribing Buspar to be taken as needed, up to twice daily. We will monitor your response to these medications and make adjustments as necessary.  For your back pain: We recommend starting Celebrex to help manage your pain and encourage you to continue with physical therapy exercises. If Celebrex is not effective, we may consider adding a muscle relaxant.

## 2024-01-14 NOTE — Assessment & Plan Note (Signed)
 Anxiety and PTSD with genetic sensitivity to many medications. Pristiq and Buspar identified as suitable. Hydroxyzine causes significant sedation. - Start Pristiq 25 mg in the morning. - Prescribe Buspar as needed, up to twice daily. - Monitor and adjust treatment based on response.

## 2024-01-20 ENCOUNTER — Other Ambulatory Visit: Payer: Self-pay

## 2024-01-20 DIAGNOSIS — Z1231 Encounter for screening mammogram for malignant neoplasm of breast: Secondary | ICD-10-CM

## 2024-01-20 NOTE — Progress Notes (Signed)
Patient due for mammogram.

## 2024-01-22 ENCOUNTER — Encounter: Payer: Self-pay | Admitting: Pediatrics

## 2024-01-31 ENCOUNTER — Ambulatory Visit: Admitting: Pediatrics

## 2024-02-04 ENCOUNTER — Ambulatory Visit: Admitting: Pediatrics

## 2024-02-06 ENCOUNTER — Encounter (HOSPITAL_COMMUNITY): Payer: Self-pay

## 2024-02-13 ENCOUNTER — Ambulatory Visit: Admitting: Pediatrics

## 2024-02-13 ENCOUNTER — Encounter: Payer: Self-pay | Admitting: Pediatrics

## 2024-02-13 VITALS — BP 121/80 | HR 84 | Temp 98.4°F | Wt 180.0 lb

## 2024-02-13 DIAGNOSIS — E785 Hyperlipidemia, unspecified: Secondary | ICD-10-CM | POA: Diagnosis not present

## 2024-02-13 DIAGNOSIS — F410 Panic disorder [episodic paroxysmal anxiety] without agoraphobia: Secondary | ICD-10-CM

## 2024-02-13 DIAGNOSIS — I10 Essential (primary) hypertension: Secondary | ICD-10-CM

## 2024-02-13 DIAGNOSIS — F4310 Post-traumatic stress disorder, unspecified: Secondary | ICD-10-CM | POA: Diagnosis not present

## 2024-02-13 DIAGNOSIS — F411 Generalized anxiety disorder: Secondary | ICD-10-CM

## 2024-02-13 DIAGNOSIS — K219 Gastro-esophageal reflux disease without esophagitis: Secondary | ICD-10-CM | POA: Diagnosis not present

## 2024-02-13 DIAGNOSIS — Z1330 Encounter for screening examination for mental health and behavioral disorders, unspecified: Secondary | ICD-10-CM

## 2024-02-13 MED ORDER — OMEPRAZOLE 40 MG PO CPDR: 40.0000 mg | DELAYED_RELEASE_CAPSULE | Freq: Two times a day (BID) | ORAL | 3 refills | Status: DC

## 2024-02-13 MED ORDER — ROSUVASTATIN CALCIUM 10 MG PO TABS: 10.0000 mg | ORAL_TABLET | Freq: Every day | ORAL | 3 refills | Status: AC

## 2024-02-13 MED ORDER — DULOXETINE HCL 20 MG PO CPEP: 20.0000 mg | ORAL_CAPSULE | Freq: Every day | ORAL | 2 refills | Status: DC

## 2024-02-13 NOTE — Progress Notes (Signed)
 Office Visit  BP 121/80   Pulse 84   Temp 98.4 F (36.9 C) (Oral)   Wt 180 lb (81.6 kg)   SpO2 99%   BMI 32.92 kg/m    Subjective:    Patient ID: Mary Hall, female    DOB: 06/25/1966, 58 y.o.   MRN: [id number]  HPI: Mary Hall is a 58 y.o. female  Chief Complaint  Patient presents with   Anxiety    Pt states she is still struggling with medicine     Discussed the use of AI scribe software for clinical note transcription with the patient, who gave verbal consent to proceed.  History of Present Illness   Mary Hall is a 58 year old female who presents for medication management and follow-up on back pain.  She experiences intermittent back pain with varying severity. She manages the pain with Celebrex , taking it twice daily with food as needed, and still has some at home.  Her medication regimen includes a preference for Cymbalta  over Pristiq , as she found it more effective previously. She also takes Prilosec, Crestor , and Cozaar. Prilosec is taken 15 to 20 minutes before her heaviest meal, which is dinner. Crestor  is taken at night, and Cozaar is taken in the morning.  She has difficulty finding her Buspar  medication and has not yet tried it. Hydroxyzine  is used for anxiety but causes significant drowsiness, and she has not attempted to cut the pills due to their small size.  She experiences significant acid reflux, describing it as 'up to here'.  She has been postponing her physical examination and other routine health maintenance activities.     Relevant past medical, surgical, family and social history reviewed and updated as indicated. Interim medical history since our last visit reviewed. Allergies and medications reviewed and updated.  ROS per HPI unless specifically indicated above     Objective:     BP 121/80   Pulse 84   Temp 98.4 F (36.9 C) (Oral)   Wt 180 lb (81.6 kg)   SpO2 99%   BMI 32.92 kg/m   Wt Readings from Last 3  Encounters:  02/13/24 180 lb (81.6 kg)  12/27/23 182 lb 6.4 oz (82.7 kg)  11/29/23 180 lb 3.2 oz (81.7 kg)     Physical Exam Constitutional:      Appearance: Normal appearance.  Pulmonary:     Effort: Pulmonary effort is normal.  Musculoskeletal:        General: Normal range of motion.  Skin:    Comments: Normal skin color  Neurological:     General: No focal deficit present.     Mental Status: She is alert. Mental status is at baseline.  Psychiatric:        Mood and Affect: Mood normal.        Behavior: Behavior normal.        Thought Content: Thought content normal.         02/13/2024    8:12 AM 01/14/2024    9:04 AM 12/27/2023    8:18 AM  Depression screen PHQ 2/9  Decreased Interest 1 0 2  Down, Depressed, Hopeless 1 0 3  PHQ - 2 Score 2 0 5  Altered sleeping 2 0 3  Tired, decreased energy 2 0 2  Change in appetite 0 0 0  Feeling bad or failure about yourself  0 0 0  Trouble concentrating 0 0 0  Moving slowly or fidgety/restless 0 0 0  Suicidal thoughts 0 0  0  PHQ-9 Score 6 0 10  Difficult doing work/chores Very difficult Not difficult at all Very difficult       02/13/2024    8:12 AM 01/14/2024    9:04 AM 12/27/2023    8:18 AM  GAD 7 : Generalized Anxiety Score  Nervous, Anxious, on Edge 2 0 3  Control/stop worrying 1 0 3  Worry too much - different things 2 0 3  Trouble relaxing 1 0 2  Restless 0 0 0  Easily annoyed or irritable 0 0 0  Afraid - awful might happen 1 0 3  Total GAD 7 Score 7 0 14  Anxiety Difficulty Not difficult at all  Extremely difficult       Assessment & Plan:  Assessment & Plan   Essential hypertension Assessment & Plan: At goal today. Continue losartan 73m.    Hyperlipidemia, unspecified hyperlipidemia type Assessment & Plan: Needs refill of below.   Orders: -     Rosuvastatin  Calcium ; Take 1 tablet (10 mg total) by mouth daily.  Dispense: 90 tablet; Refill: 3  Posttraumatic stress disorder Assessment &  Plan: Prefers Cymbalta  over Pristiq . Genetic testing suggests lower doses. Concern about weight gain with new medications. - Prescribe Cymbalta  20 mg daily with food. Consider dose increase after consultation if partial improvement. - Discontinue Pristiq .  Orders: -     DULoxetine  HCl; Take 1 capsule (20 mg total) by mouth daily.  Dispense: 30 capsule; Refill: 2  Panic attacks GAD (generalized anxiety disorder) Assessment & Plan: Managed with hydroxyzine  causing drowsiness. Buspar  not tried. Propranolol considered. - Try Buspar  as needed, replacing hydroxyzine . - Consider propranolol if Buspar  is ineffective or causes drowsiness.   Orders: -     DULoxetine  HCl; Take 1 capsule (20 mg total) by mouth daily.  Dispense: 30 capsule; Refill: 2  Gastroesophageal reflux disease without esophagitis Assessment & Plan: Severe, reaching throat, requires management adjustment. - Increase omeprazole  to twice daily, 15-20 minutes before meals.  Orders: -     Omeprazole ; Take 1 capsule (40 mg total) by mouth 2 (two) times daily before a meal.  Dispense: 60 capsule; Refill: 3  Encounter for behavioral health screening As part of their intake evaluation, the patient was screened for depression, anxiety.  PHQ9 SCORE 6, GAD7 SCORE 7. Screening results somewhat positive for tested conditions. See plan under problem/diagnosis above.    Follow up plan: Return in about 4 weeks (around 03/12/2024).  Hadassah Letters, MD

## 2024-02-13 NOTE — Patient Instructions (Addendum)
 Switching to cymbalta  daily no pristiq   Increase omeprazole  to twice daily 15-20 minutes before meals  Try buspar  - as needed twice daily  Can celebrex  twice dialy if still having back pain for 2 more weeks

## 2024-02-19 ENCOUNTER — Encounter: Payer: Self-pay | Admitting: Pediatrics

## 2024-02-19 NOTE — Assessment & Plan Note (Signed)
 Needs refill of below.

## 2024-02-19 NOTE — Assessment & Plan Note (Signed)
 Managed with hydroxyzine  causing drowsiness. Buspar  not tried. Propranolol considered. - Try Buspar  as needed, replacing hydroxyzine . - Consider propranolol if Buspar  is ineffective or causes drowsiness.

## 2024-02-19 NOTE — Assessment & Plan Note (Signed)
 Severe, reaching throat, requires management adjustment. - Increase omeprazole  to twice daily, 15-20 minutes before meals.

## 2024-02-19 NOTE — Assessment & Plan Note (Signed)
 At goal today. Continue losartan 37m.

## 2024-02-19 NOTE — Assessment & Plan Note (Signed)
 Prefers Cymbalta  over Pristiq . Genetic testing suggests lower doses. Concern about weight gain with new medications. - Prescribe Cymbalta  20 mg daily with food. Consider dose increase after consultation if partial improvement. - Discontinue Pristiq .

## 2024-03-05 ENCOUNTER — Ambulatory Visit: Admitting: Pediatrics

## 2024-03-05 ENCOUNTER — Encounter: Payer: Self-pay | Admitting: Pediatrics

## 2024-03-05 VITALS — BP 138/83 | HR 83 | Temp 98.3°F | Wt 185.8 lb

## 2024-03-05 DIAGNOSIS — R252 Cramp and spasm: Secondary | ICD-10-CM

## 2024-03-05 DIAGNOSIS — Z8639 Personal history of other endocrine, nutritional and metabolic disease: Secondary | ICD-10-CM

## 2024-03-05 DIAGNOSIS — F411 Generalized anxiety disorder: Secondary | ICD-10-CM

## 2024-03-05 DIAGNOSIS — Z1330 Encounter for screening examination for mental health and behavioral disorders, unspecified: Secondary | ICD-10-CM

## 2024-03-05 DIAGNOSIS — R2689 Other abnormalities of gait and mobility: Secondary | ICD-10-CM

## 2024-03-05 DIAGNOSIS — F410 Panic disorder [episodic paroxysmal anxiety] without agoraphobia: Secondary | ICD-10-CM | POA: Diagnosis not present

## 2024-03-05 DIAGNOSIS — I10 Essential (primary) hypertension: Secondary | ICD-10-CM | POA: Diagnosis not present

## 2024-03-05 DIAGNOSIS — F4310 Post-traumatic stress disorder, unspecified: Secondary | ICD-10-CM

## 2024-03-05 DIAGNOSIS — Z131 Encounter for screening for diabetes mellitus: Secondary | ICD-10-CM

## 2024-03-05 MED ORDER — HYDROXYZINE HCL 10 MG PO TABS
5.0000 mg | ORAL_TABLET | Freq: Three times a day (TID) | ORAL | 0 refills | Status: AC | PRN
Start: 2024-03-05 — End: ?

## 2024-03-05 MED ORDER — LOSARTAN POTASSIUM 25 MG PO TABS: 25.0000 mg | ORAL_TABLET | Freq: Every day | ORAL | 3 refills | Status: DC

## 2024-03-05 MED ORDER — DULOXETINE HCL 20 MG PO CPEP: 20.0000 mg | ORAL_CAPSULE | Freq: Every day | ORAL | 2 refills | Status: DC

## 2024-03-05 NOTE — Progress Notes (Signed)
 Office Visit  BP 138/83   Pulse 83   Temp 98.3 F (36.8 C) (Oral)   Wt 185 lb 12.8 oz (84.3 kg)   SpO2 98%   BMI 33.98 kg/m    Subjective:    Patient ID: Mary Hall, female    DOB: 1965/10/29, 58 y.o.   MRN: [id number]  HPI: Mary Hall is a 58 y.o. female  Chief Complaint  Patient presents with   Anxiety    Discussed the use of AI scribe software for clinical note transcription with the patient, who gave verbal consent to proceed.  History of Present Illness   Mary Hall is a 58 year old female who presents with leg pain and medication management.  She experiences intermittent aching leg pain throughout the day, which has gradually worsened over time. The pain is described as similar to having 'walked to the mall and back' and is accompanied by cramping, especially in the morning. She is concerned about a possible connection to her past low B12 levels, which were treated with B12 shots several years ago. Her B12 levels were normal a year ago.  She is currently taking Cymbalta  at night for anxiety management, finding the dose effective and satisfactory. She also uses hydroxyzine  as needed for acute anxiety episodes. Celebrex  does not alleviate her back pain, but her back pain is not worse than usual. She recalls that potassium tablets previously improved her sleep and is open to checking her electrolytes to see if they contribute to her leg pain.  She reports a clogged sensation in her ear for the past couple of weeks, which is not painful. She has been using over-the-counter ear drops for earwax management.  She discusses her social security situation, noting relief after receiving a back pay deposit without a formal letter. Her husband passed away less than a year ago after a brief illness.      Relevant past medical, surgical, family and social history reviewed and updated as indicated. Interim medical history since our last visit reviewed. Allergies and  medications reviewed and updated.  ROS per HPI unless specifically indicated above     Objective:     BP 138/83   Pulse 83   Temp 98.3 F (36.8 C) (Oral)   Wt 185 lb 12.8 oz (84.3 kg)   SpO2 98%   BMI 33.98 kg/m   Wt Readings from Last 3 Encounters:  03/05/24 185 lb 12.8 oz (84.3 kg)  02/13/24 180 lb (81.6 kg)  12/27/23 182 lb 6.4 oz (82.7 kg)     Physical Exam Constitutional:      Appearance: Normal appearance.  Pulmonary:     Effort: Pulmonary effort is normal.  Musculoskeletal:        General: Normal range of motion.  Skin:    Comments: Normal skin color  Neurological:     General: No focal deficit present.     Mental Status: She is alert. Mental status is at baseline.  Psychiatric:        Mood and Affect: Mood normal.        Behavior: Behavior normal.        Thought Content: Thought content normal.         03/05/2024    8:15 AM 02/13/2024    8:12 AM 01/14/2024    9:04 AM 12/27/2023    8:18 AM  Depression screen PHQ 2/9  Decreased Interest 1 1 0 2  Down, Depressed, Hopeless 1 1 0 3  PHQ - 2  Score 2 2 0 5  Altered sleeping 0 2 0 3  Tired, decreased energy 0 2 0 2  Change in appetite 0 0 0 0  Feeling bad or failure about yourself  0 0 0 0  Trouble concentrating 0 0 0 0  Moving slowly or fidgety/restless 0 0 0 0  Suicidal thoughts 0 0 0 0  PHQ-9 Score 2 6 0 10  Difficult doing work/chores Somewhat difficult Very difficult Not difficult at all Very difficult       03/05/2024    8:16 AM 02/13/2024    8:12 AM 01/14/2024    9:04 AM 12/27/2023    8:18 AM  GAD 7 : Generalized Anxiety Score  Nervous, Anxious, on Edge 2 2 0 3  Control/stop worrying 1 1 0 3  Worry too much - different things 1 2 0 3  Trouble relaxing 1 1 0 2  Restless 0 0 0 0  Easily annoyed or irritable 0 0 0 0  Afraid - awful might happen 1 1 0 3  Total GAD 7 Score 6 7 0 14  Anxiety Difficulty Somewhat difficult Not difficult at all  Extremely difficult       Assessment & Plan:   Assessment & Plan   GAD (generalized anxiety disorder) Posttraumatic stress disorder Panic attacks Assessment & Plan: Well controlled on cymbalta  20mg . Hydroxyzine  used for acute relief. - Continue Cymbalta . - Continue hydroxyzine  as needed. - Consider buspirone  if needed, prescription sent. (Never filled by pharmacy)  Orders: -     DULoxetine  HCl; Take 1 capsule (20 mg total) by mouth daily.  Dispense: 30 capsule; Refill: 2  -     hydrOXYzine  HCl; Take 0.5-1 tablets (5-10 mg total) by mouth 3 (three) times daily as needed for anxiety.  Dispense: 30 tablet; Refill: 0  Essential hypertension Assessment & Plan: Well controlled. Sending refills, consider increasing if borderline at follow up.  Orders: -     Losartan Potassium; Take 1 tablet (25 mg total) by mouth daily.  Dispense: 90 tablet; Refill: 3  History of non anemic vitamin B12 deficiency Normal last year, plan to repeat. -     Vitamin B12 -     Folate  Diabetes mellitus screening -     Hemoglobin A1c  Leg cramps Chronic leg pain with worsening symptoms, possibly due to electrolyte imbalance or magnesium deficiency. - Check electrolyte levels. - Recommend over-the-counter magnesium supplementation. -     Comprehensive metabolic panel with GFR -     CBC with Differential/Platelet -     Iron, TIBC and Ferritin Panel  Poor balance Inquiring about shower seat. DME order printed. -     For home use only DME Other see comment  Encounter for behavioral health screening As part of their intake evaluation, the patient was screened for depression, anxiety.  PHQ9 SCORE 2, GAD7 SCORE 6. Improved from prior. See plan under problem/diagnosis above.   Follow up plan: Return in about 3 months (around 06/05/2024) for Physical ?pap.  Hadassah Letters, MD

## 2024-03-05 NOTE — Patient Instructions (Addendum)
 Pick up some magnesium tablets and daily multivitamin.  Debrox for ear wax.    [facility] [employer] [facility], [employer]. Medical supply store in [place], [place]  Address: [address], [place], [place] [address]

## 2024-03-05 NOTE — Assessment & Plan Note (Signed)
 Well controlled on cymbalta  20mg . Hydroxyzine  used for acute relief. - Continue Cymbalta . - Continue hydroxyzine  as needed. - Consider buspirone  if needed, prescription sent. (Never filled by pharmacy)

## 2024-03-05 NOTE — Assessment & Plan Note (Signed)
 Well controlled. Sending refills, consider increasing if borderline at follow up.

## 2024-03-06 LAB — COMPREHENSIVE METABOLIC PANEL WITH GFR
ALT: 12 IU/L (ref 0–32)
AST: 14 IU/L (ref 0–40)
Albumin: 4.3 g/dL (ref 3.8–4.9)
Alkaline Phosphatase: 134 IU/L — ABNORMAL HIGH (ref 44–121)
BUN/Creatinine Ratio: 22 (ref 9–23)
BUN: 13 mg/dL (ref 6–24)
Bilirubin Total: <0.2 mg/dL (ref 0.0–1.2)
CO2: 23 mmol/L (ref 20–29)
Calcium: 9.1 mg/dL (ref 8.7–10.2)
Chloride: 104 mmol/L (ref 96–106)
Creatinine, Ser: 0.59 mg/dL (ref 0.57–1.00)
Globulin, Total: 2.3 g/dL (ref 1.5–4.5)
Glucose: 98 mg/dL (ref 70–99)
Potassium: 4.2 mmol/L (ref 3.5–5.2)
Sodium: 141 mmol/L (ref 134–144)
Total Protein: 6.6 g/dL (ref 6.0–8.5)
eGFR: 106 mL/min/{1.73_m2} (ref >59)

## 2024-03-06 LAB — FOLATE: Folate: 10.9 ng/mL (ref >3.0)

## 2024-03-06 LAB — CBC WITH DIFFERENTIAL/PLATELET
Basophils Absolute: 0.1 10*3/uL (ref 0.0–0.2)
Basos: 1 %
EOS (ABSOLUTE): 0.1 10*3/uL (ref 0.0–0.4)
Eos: 2 %
Hematocrit: 38 % (ref 34.0–46.6)
Hemoglobin: 12.7 g/dL (ref 11.1–15.9)
Immature Grans (Abs): 0 10*3/uL (ref 0.0–0.1)
Immature Granulocytes: 0 %
Lymphocytes Absolute: 2.8 10*3/uL (ref 0.7–3.1)
Lymphs: 39 %
MCH: 30.2 pg (ref 26.6–33.0)
MCHC: 33.4 g/dL (ref 31.5–35.7)
MCV: 91 fL (ref 79–97)
Monocytes Absolute: 0.7 10*3/uL (ref 0.1–0.9)
Monocytes: 10 %
Neutrophils Absolute: 3.6 10*3/uL (ref 1.4–7.0)
Neutrophils: 48 %
Platelets: 358 10*3/uL (ref 150–450)
RBC: 4.2 x10E6/uL (ref 3.77–5.28)
RDW: 12.6 % (ref 11.7–15.4)
WBC: 7.3 10*3/uL (ref 3.4–10.8)

## 2024-03-06 LAB — IRON,TIBC AND FERRITIN PANEL
Ferritin: 59 ng/mL (ref 15–150)
Iron Saturation: 12 % — ABNORMAL LOW (ref 15–55)
Iron: 41 ug/dL (ref 27–159)
Total Iron Binding Capacity: 342 ug/dL (ref 250–450)
UIBC: 301 ug/dL (ref 131–425)

## 2024-03-06 LAB — VITAMIN B12: Vitamin B-12: 335 pg/mL (ref 232–1245)

## 2024-03-06 LAB — HEMOGLOBIN A1C
Est. average glucose Bld gHb Est-mCnc: 114 mg/dL
Hgb A1c MFr Bld: 5.6 % (ref 4.8–5.6)

## 2024-03-09 ENCOUNTER — Ambulatory Visit: Payer: Self-pay | Admitting: Pediatrics

## 2024-03-10 DIAGNOSIS — M1990 Unspecified osteoarthritis, unspecified site: Secondary | ICD-10-CM | POA: Diagnosis not present

## 2024-03-13 ENCOUNTER — Other Ambulatory Visit: Payer: Self-pay | Admitting: Pediatrics

## 2024-03-13 DIAGNOSIS — K219 Gastro-esophageal reflux disease without esophagitis: Secondary | ICD-10-CM

## 2024-03-13 NOTE — Telephone Encounter (Signed)
 Copied from CRM [id number]. Topic: Clinical - Prescription Issue >> Mar 13, 2024  3:36 PM Elle L wrote: Reason for CRM: The patient states that Dr. Juliette Oh recently changed her omeprazole  (PRILOSEC) 40 MG capsule prescription to 2 times daily. However the Pharmacy is advising her that it is too soon to pick up her refill and she cannot pick it up until the 20th but the patient only has enough to last her 3-4 days. Her call back number is [phone].

## 2024-03-17 ENCOUNTER — Telehealth: Payer: Self-pay

## 2024-03-17 ENCOUNTER — Other Ambulatory Visit (HOSPITAL_COMMUNITY): Payer: Self-pay

## 2024-03-17 NOTE — Telephone Encounter (Signed)
 Pharmacy Patient Advocate Encounter   Received notification from Patient Advice Request messages that prior authorization for Omeprazole  40MG  dr capsules is required/requested.   Insurance verification completed.   The patient is insured through [facility] .   Per test claim: Refill too soon. PA is not needed at this time. Medication was filled 02/26/2024. Next eligible fill date is 03/20/2024.  ATTEMPTED PA IN CASE IT WAS STILL NEEDED FOR THE 2 CAPSULES DAILY DOSE, PA WAS CANCELLED BY CMM WITH FOLLOWING MESSAGE:

## 2024-03-17 NOTE — Telephone Encounter (Signed)
 Per test claim, medication will fill again on 03/20/2024. When attempting a PA (just in case) CMM cancelled the PA with the following message:

## 2024-05-26 ENCOUNTER — Ambulatory Visit: Payer: Self-pay

## 2024-05-26 NOTE — Telephone Encounter (Signed)
 FYI Only or Action Required?: FYI only for provider.  Patient was last seen in primary care on 03/05/2024 by Herold Hadassah SQUIBB, MD.  Called Nurse Triage reporting Dysuria.  Symptoms began a week ago.  Interventions attempted: Rest, hydration, or home remedies.  Symptoms are: unchanged.  Triage Disposition: See Physician Within 24 Hours  Patient/caregiver understands and will follow disposition?: Yes   Copied from CRM #[id number]. Topic: Clinical - Red Word Triage >> May 26, 2024 10:38 AM Emylou G wrote: [facility] that prompted transfer to Nurse Triage: UTI?  Pain and burning when she goes to the bathroom. Reason for Disposition  Age > 50 years  Answer Assessment - Initial Assessment Questions 1. SEVERITY: How bad is the pain?  (e.g., Scale 1-10; mild, moderate, or severe)     burning 2. FREQUENCY: How many times have you had painful urination today?      Increased  3. PATTERN: Is pain present every time you urinate or just sometimes?      With each void X 2 days  4. ONSET: When did the painful urination start?      One week  5. FEVER: Do you have a fever? If Yes, ask: What is your temperature, how was it measured, and when did it start?     denies 6. PAST UTI: Have you had a urine infection before? If Yes, ask: When was the last time? and What happened that time?      Yes, abx  7. CAUSE: What do you think is causing the painful urination?  (e.g., UTI, scratch, Herpes sore)     Uti  8. OTHER SYMPTOMS: Do you have any other symptoms? (e.g., blood in urine, flank pain, genital sores, urgency, vaginal discharge)     Denies  Protocols used: Urination Pain - Female-A-AH

## 2024-05-27 ENCOUNTER — Encounter: Payer: Self-pay | Admitting: Pediatrics

## 2024-05-27 ENCOUNTER — Ambulatory Visit (INDEPENDENT_AMBULATORY_CARE_PROVIDER_SITE_OTHER): Admitting: Pediatrics

## 2024-05-27 VITALS — BP 139/84 | HR 92 | Temp 98.3°F | Wt 184.3 lb

## 2024-05-27 DIAGNOSIS — N760 Acute vaginitis: Secondary | ICD-10-CM | POA: Diagnosis not present

## 2024-05-27 DIAGNOSIS — N949 Unspecified condition associated with female genital organs and menstrual cycle: Secondary | ICD-10-CM | POA: Diagnosis not present

## 2024-05-27 DIAGNOSIS — B9689 Other specified bacterial agents as the cause of diseases classified elsewhere: Secondary | ICD-10-CM | POA: Diagnosis not present

## 2024-05-27 DIAGNOSIS — R399 Unspecified symptoms and signs involving the genitourinary system: Secondary | ICD-10-CM | POA: Diagnosis not present

## 2024-05-27 LAB — URINALYSIS, ROUTINE W REFLEX MICROSCOPIC
Bilirubin, UA: NEGATIVE
Glucose, UA: NEGATIVE
Ketones, UA: NEGATIVE
Leukocytes,UA: NEGATIVE
Nitrite, UA: NEGATIVE
Protein,UA: NEGATIVE
Specific Gravity, UA: <1.005 — ABNORMAL LOW (ref 1.005–1.030)
Urobilinogen, Ur: 0.2 mg/dL (ref 0.2–1.0)
pH, UA: 6 (ref 5.0–7.5)

## 2024-05-27 LAB — WET PREP FOR TRICH, YEAST, CLUE
Clue Cell Exam: POSITIVE — AB
Trichomonas Exam: NEGATIVE
Yeast Exam: NEGATIVE

## 2024-05-27 LAB — MICROSCOPIC EXAMINATION
Bacteria, UA: NONE SEEN
WBC, UA: NONE SEEN /HPF (ref 0–5)

## 2024-05-27 MED ORDER — METRONIDAZOLE 500 MG PO TABS: 500.0000 mg | ORAL_TABLET | Freq: Two times a day (BID) | ORAL | 0 refills | Status: AC

## 2024-05-27 MED ORDER — FLUCONAZOLE 150 MG PO TABS: 150.0000 mg | ORAL_TABLET | Freq: Once | ORAL | 0 refills | Status: AC

## 2024-05-27 NOTE — Progress Notes (Signed)
 Office Visit  BP 139/84   Pulse 92   Temp 98.3 F (36.8 C) (Oral)   Wt 184 lb 4.8 oz (83.6 kg)   SpO2 98%   BMI 33.71 kg/m    Subjective:    Patient ID: Mary Hall, female    DOB: 08/21/1966, 58 y.o.   MRN: [id number]  HPI: Mary Hall is a 58 y.o. female  Chief Complaint  Patient presents with   Dysuria    Symptoms started last week and has been on and off since drank cranberry juice it seemed to help     Discussed the use of AI scribe software for clinical note transcription with the patient, who gave verbal consent to proceed.  History of Present Illness   Mary Hall is a 58 year old female who presents with dysuria and concerns about a possible urinary tract infection.  She experiences intermittent pain with urination, noting occurrences yesterday and one day last week. Drinking cranberry juice seemed to alleviate the symptoms. Given the recurrence, she decided to seek medical evaluation.  She denies frequent urinary tract infections, stating it has been a long time since her last one. No other symptoms such as discharge or itchiness are present. Since her husband's passing, she has had intercourse only a couple of times, with the last occurrence about a month ago, and she used protection.  She has had a hysterectomy.        Relevant past medical, surgical, family and social history reviewed and updated as indicated. Interim medical history since our last visit reviewed. Allergies and medications reviewed and updated.  ROS per HPI unless specifically indicated above     Objective:    BP 139/84   Pulse 92   Temp 98.3 F (36.8 C) (Oral)   Wt 184 lb 4.8 oz (83.6 kg)   SpO2 98%   BMI 33.71 kg/m   Wt Readings from Last 3 Encounters:  05/27/24 184 lb 4.8 oz (83.6 kg)  03/05/24 185 lb 12.8 oz (84.3 kg)  02/13/24 180 lb (81.6 kg)     Physical Exam Constitutional:      Appearance: Normal appearance.  Pulmonary:     Effort: Pulmonary effort  is normal.  Musculoskeletal:        General: Normal range of motion.  Skin:    Comments: Normal skin color  Neurological:     General: No focal deficit present.     Mental Status: She is alert. Mental status is at baseline.  Psychiatric:        Mood and Affect: Mood normal.        Behavior: Behavior normal.        Thought Content: Thought content normal.         05/27/2024    9:04 AM 03/05/2024    8:15 AM 02/13/2024    8:12 AM 01/14/2024    9:04 AM 12/27/2023    8:18 AM  Depression screen PHQ 2/9  Decreased Interest 1 1 1  0 2  Down, Depressed, Hopeless 1 1 1  0 3  PHQ - 2 Score 2 2 2  0 5  Altered sleeping 1 0 2 0 3  Tired, decreased energy 1 0 2 0 2  Change in appetite 2 0 0 0 0  Feeling bad or failure about yourself  1 0 0 0 0  Trouble concentrating 0 0 0 0 0  Moving slowly or fidgety/restless 0 0 0 0 0  Suicidal thoughts 0 0 0 0 0  PHQ-9 Score 7 2 6  0 10  Difficult doing work/chores Not difficult at all Somewhat difficult Very difficult Not difficult at all Very difficult       05/27/2024    9:04 AM 03/05/2024    8:16 AM 02/13/2024    8:12 AM 01/14/2024    9:04 AM  GAD 7 : Generalized Anxiety Score  Nervous, Anxious, on Edge 1 2 2  0  Control/stop worrying 1 1 1  0  Worry too much - different things 1 1 2  0  Trouble relaxing 1 1 1  0  Restless 0 0 0 0  Easily annoyed or irritable 0 0 0 0  Afraid - awful might happen 0 1 1 0  Total GAD 7 Score 4 6 7  0  Anxiety Difficulty Not difficult at all Somewhat difficult Not difficult at all        Assessment & Plan:  Assessment & Plan   BV (bacterial vaginosis) Positive clue cells on swab w clear urinalysis, confirmed bacterial vaginosis. Treatment recommended despite absence of discharge given urinary symptoms vague. - Prescribed metronidazole  (Flagyl ). - Sent prescription for Diflucan  for potential post-antibiotic yeast infection, advised not to pick up unless needed. -     WET PREP FOR TRICH, YEAST, CLUE -      metroNIDAZOLE ; Take 1 tablet (500 mg total) by mouth 2 (two) times daily for 7 days.  Dispense: 14 tablet; Refill: 0 -     Fluconazole ; Take 1 tablet (150 mg total) by mouth once for 1 dose.  Dispense: 1 tablet; Refill: 0  Urinary tract infection symptoms Intermittent dysuria with recent episodes. Urinalysis negative for UTI, cranberry juice may have affected results. Differential includes resolving UTI or bacterial vaginosis-related symptoms. - Send urine culture to confirm or rule out UTI. - Advised that persistent symptoms despite bacterial vaginosis treatment may indicate resolving UTI. - Consider antibiotic change if urine culture indicates UTI.  -     Urinalysis, Routine w reflex microscopic -     Urine Culture   Follow up plan: No follow-ups on file.  Mary SHAUNNA Nett, MD

## 2024-05-28 ENCOUNTER — Ambulatory Visit: Payer: Self-pay | Admitting: Pediatrics

## 2024-05-30 LAB — URINE CULTURE

## 2024-06-04 ENCOUNTER — Other Ambulatory Visit: Payer: Self-pay | Admitting: Pediatrics

## 2024-06-04 DIAGNOSIS — N3000 Acute cystitis without hematuria: Secondary | ICD-10-CM

## 2024-06-04 MED ORDER — NITROFURANTOIN MONOHYD MACRO 100 MG PO CAPS: 100.0000 mg | ORAL_CAPSULE | Freq: Two times a day (BID) | ORAL | 0 refills | Status: DC

## 2024-06-04 NOTE — Progress Notes (Signed)
 Sent macrobid  per patient request for persistent symptoms, positive culture.  Hadassah SHAUNNA Nett, MD

## 2024-06-05 ENCOUNTER — Encounter: Payer: Self-pay | Admitting: Pediatrics

## 2024-06-05 ENCOUNTER — Ambulatory Visit: Admitting: Pediatrics

## 2024-06-05 VITALS — BP 132/86 | HR 94 | Temp 97.8°F | Wt 184.5 lb

## 2024-06-05 DIAGNOSIS — M546 Pain in thoracic spine: Secondary | ICD-10-CM

## 2024-06-05 DIAGNOSIS — H9313 Tinnitus, bilateral: Secondary | ICD-10-CM | POA: Diagnosis not present

## 2024-06-05 DIAGNOSIS — Z23 Encounter for immunization: Secondary | ICD-10-CM

## 2024-06-05 DIAGNOSIS — F4310 Post-traumatic stress disorder, unspecified: Secondary | ICD-10-CM

## 2024-06-05 DIAGNOSIS — G8929 Other chronic pain: Secondary | ICD-10-CM

## 2024-06-05 DIAGNOSIS — R399 Unspecified symptoms and signs involving the genitourinary system: Secondary | ICD-10-CM | POA: Diagnosis not present

## 2024-06-05 DIAGNOSIS — M545 Low back pain, unspecified: Secondary | ICD-10-CM

## 2024-06-05 DIAGNOSIS — L720 Epidermal cyst: Secondary | ICD-10-CM | POA: Diagnosis not present

## 2024-06-05 LAB — URINALYSIS, ROUTINE W REFLEX MICROSCOPIC
Bilirubin, UA: NEGATIVE
Glucose, UA: NEGATIVE
Nitrite, UA: NEGATIVE
Specific Gravity, UA: 1.025 (ref 1.005–1.030)
Urobilinogen, Ur: 0.2 mg/dL (ref 0.2–1.0)
pH, UA: 6 (ref 5.0–7.5)

## 2024-06-05 LAB — MICROSCOPIC EXAMINATION: Bacteria, UA: NONE SEEN

## 2024-06-05 MED ORDER — CELECOXIB 200 MG PO CAPS: 200.0000 mg | ORAL_CAPSULE | Freq: Two times a day (BID) | ORAL | 0 refills | Status: DC

## 2024-06-05 MED ORDER — BACLOFEN 5 MG PO TABS: 5.0000 mg | ORAL_TABLET | Freq: Three times a day (TID) | ORAL | 0 refills | Status: AC | PRN

## 2024-06-05 MED ORDER — METHYLPREDNISOLONE 4 MG PO TBPK: ORAL_TABLET | ORAL | 0 refills | Status: DC

## 2024-06-05 NOTE — Assessment & Plan Note (Signed)
 Previously on cymbalta . Never started pristiq . She notes she feels stable and well managed with prn hydroxyzine . CTM.

## 2024-06-05 NOTE — Patient Instructions (Addendum)
 For your back pain:  Steroid pack to help ease the inflammation You will also start celebrex  100mg  twice daily (anti-inflammatory) to take for 14-30 days. You can take tylenol  while on this medication but do not take ibuprofen or other NSAIDs I am also going to send baclofen  5mg  (muscle relaxant) to use as needed, max 3 times a day  If pain is worsening please seek medical attention  If not improving in 2 weeks, call us  as you may need re-evaluation and maybe start Physical therapy   For your cyst, put warm compresses to the area, if worsening next week call and we can drain it

## 2024-06-05 NOTE — Assessment & Plan Note (Signed)
 Acute on chronic lumbar back pain likely muscular, possibly from strain from dog. Very tender on exam in LB. Significant pain causing sleep disturbances. No red flag sx. - Prescribe Celebrex  for 14 days. - Prescribe Steripak for 5 days. - Prescribe muscle relaxant. - Advise urgent care or ER if pain worsens over weekend. - Schedule follow-up in 4-6 weeks. - Advise contacting office if no improvement in 2 weeks for potential physical therapy referral.

## 2024-06-05 NOTE — Assessment & Plan Note (Signed)
 Bilateral tinnitus, more significant in right ear with cerumen impaction. Tinnitus possibly age-related. - Use Debrox for cerumen impaction. - Refer for audiology testing.

## 2024-06-05 NOTE — Assessment & Plan Note (Signed)
 Epidermoid cyst in axilla, larger and tender. - Apply warm compresses for a week. - Advise follow-up if no improvement for potential drainage.

## 2024-06-05 NOTE — Progress Notes (Signed)
 Office Visit  BP 132/86   Pulse 94   Temp 97.8 F (36.6 C) (Oral)   Wt 184 lb 8 oz (83.7 kg)   SpO2 98%   BMI 33.75 kg/m    Subjective:    Patient ID: Mary Hall, female    DOB: 1966-01-01, 58 y.o.   MRN: [id number]  HPI: Mary Hall is a 58 y.o. female  Chief Complaint  Patient presents with   Annual Exam   Back Pain    Believes the dog pulled something     Discussed the use of AI scribe software for clinical note transcription with the patient, who gave verbal consent to proceed.  History of Present Illness   Mary Hall is a 58 year old female who presents with back pain and urinary symptoms.  She has been experiencing back pain since Tuesday or Wednesday after her dog pulled on her. The pain is located in the middle and right side of her back. It is severe enough to wake her up if she turns in her sleep. No numbness in the genital area. She has not taken any muscle relaxants recently, though she has used them in the past.  She is currently taking metronidazole  and Macrobid  for a urinary tract infection. She initially received metronidazole  for bacterial vaginosis, and Macrobid  was added after her urine culture grew bacteria. She is no longer experiencing urinary symptoms.  She mentions experiencing ringing in both ears for quite some time, with a noted change in hearing in her right ear. She has not been exposed to loud noises or concerts in the past.  She reports a lump in her armpit, described as a bump that has grown larger than usual. These bumps typically resolve on their own, but this one has persisted for several weeks.  She is not currently taking Pristiq , Buspar , or Cymbalta , and she stopped taking Celexa. She uses hydroxyzine  as needed, which makes her sleepy but is effective.        Relevant past medical, surgical, family and social history reviewed and updated as indicated. Interim medical history since our last visit reviewed. Allergies  and medications reviewed and updated.  ROS per HPI unless specifically indicated above     Objective:    BP 132/86   Pulse 94   Temp 97.8 F (36.6 C) (Oral)   Wt 184 lb 8 oz (83.7 kg)   SpO2 98%   BMI 33.75 kg/m   Wt Readings from Last 3 Encounters:  06/05/24 184 lb 8 oz (83.7 kg)  05/27/24 184 lb 4.8 oz (83.6 kg)  03/05/24 185 lb 12.8 oz (84.3 kg)     Physical Exam Constitutional:      Appearance: Normal appearance.  HENT:     Right Ear: There is impacted cerumen.     Left Ear: There is no impacted cerumen.  Pulmonary:     Effort: Pulmonary effort is normal.  Abdominal:     Tenderness: There is no right CVA tenderness or left CVA tenderness.  Musculoskeletal:     Thoracic back: Normal.     Lumbar back: Tenderness present. Decreased range of motion. Negative right straight leg raise test and negative left straight leg raise test.       Back:     Comments: Paraspinal tenderness in LB, midline bony tenderness  Skin:    Findings: Lesion present.     Comments: Epidermoid cyst left armpit, erythematous  Neurological:     General: No focal deficit present.  Mental Status: She is alert. Mental status is at baseline.  Psychiatric:        Mood and Affect: Mood normal.        Behavior: Behavior normal.        Thought Content: Thought content normal.         05/27/2024    9:04 AM 03/05/2024    8:15 AM 02/13/2024    8:12 AM 01/14/2024    9:04 AM 12/27/2023    8:18 AM  Depression screen PHQ 2/9  Decreased Interest 1 1 1  0 2  Down, Depressed, Hopeless 1 1 1  0 3  PHQ - 2 Score 2 2 2  0 5  Altered sleeping 1 0 2 0 3  Tired, decreased energy 1 0 2 0 2  Change in appetite 2 0 0 0 0  Feeling bad or failure about yourself  1 0 0 0 0  Trouble concentrating 0 0 0 0 0  Moving slowly or fidgety/restless 0 0 0 0 0  Suicidal thoughts 0 0 0 0 0  PHQ-9 Score 7 2 6  0 10  Difficult doing work/chores Not difficult at all Somewhat difficult Very difficult Not difficult at all Very  difficult       05/27/2024    9:04 AM 03/05/2024    8:16 AM 02/13/2024    8:12 AM 01/14/2024    9:04 AM  GAD 7 : Generalized Anxiety Score  Nervous, Anxious, on Edge 1 2 2  0  Control/stop worrying 1 1 1  0  Worry too much - different things 1 1 2  0  Trouble relaxing 1 1 1  0  Restless 0 0 0 0  Easily annoyed or irritable 0 0 0 0  Afraid - awful might happen 0 1 1 0  Total GAD 7 Score 4 6 7  0  Anxiety Difficulty Not difficult at all Somewhat difficult Not difficult at all        Assessment & Plan:  Assessment & Plan   Acute midline low back pain without sciatica Chronic thoracic spine pain Assessment & Plan: Acute on chronic lumbar back pain likely muscular, possibly from strain from dog. Very tender on exam in LB. Significant pain causing sleep disturbances. No red flag sx. - Prescribe Celebrex  for 14 days. - Prescribe Steripak for 5 days. - Prescribe muscle relaxant. - Advise urgent care or ER if pain worsens over weekend. - Schedule follow-up in 4-6 weeks. - Advise contacting office if no improvement in 2 weeks for potential physical therapy referral. -     Celecoxib ; Take 1 capsule (200 mg total) by mouth 2 (two) times daily.  Dispense: 60 capsule; Refill: 0 -     Baclofen ; Take 1 tablet (5 mg total) by mouth 3 (three) times daily as needed for muscle spasms.  Dispense: 30 tablet; Refill: 0 -     methylPREDNISolone ; Follow packaging instructions  Dispense: 21 each; Refill: 0   Posttraumatic stress disorder Assessment & Plan: Previously on cymbalta . Never started pristiq . She notes she feels stable and well managed with prn hydroxyzine . CTM.   Tinnitus of both ears Assessment & Plan: Bilateral tinnitus, more significant in right ear with cerumen impaction. Tinnitus possibly age-related. - Use Debrox for cerumen impaction. - Refer for audiology testing.  Orders: -     Ambulatory referral to Audiology  Epidermoid cyst Assessment & Plan: Epidermoid cyst in axilla,  larger and tender. - Apply warm compresses for a week. - Advise follow-up if no improvement for potential drainage.  Urinary tract infection symptoms Recent UTI with bacterial growth. Currently on metronidazole , not ideal for UTIs but generally covers her. No urinary symptoms. - Check urine for UTI resolution. - Advise against picking up Macrobid  if asymptomatic. -     Urinalysis, Routine w reflex microscopic -     Urine Culture  Immunization due -     Flu vaccine trivalent PF, 6mos and older(Flulaval,Afluria,Fluarix,Fluzone)   Follow up plan: Return in about 4 weeks (around 07/03/2024) for Physical.  Hadassah SHAUNNA Nett, MD

## 2024-06-07 LAB — URINE CULTURE

## 2024-06-09 ENCOUNTER — Ambulatory Visit: Payer: Self-pay | Admitting: Pediatrics

## 2024-07-09 ENCOUNTER — Telehealth: Payer: Self-pay | Admitting: Pediatrics

## 2024-07-09 ENCOUNTER — Encounter: Admitting: Pediatrics

## 2024-07-09 NOTE — Telephone Encounter (Signed)
 Called to reschedule appt that was scheduled for 10/9 Physical, I see that patient had a visit on 06/03/24 that was scheduled as a physical, but patient states she did not have a physical that day instead she had an OV. I scheduled her physical for 10/23

## 2024-07-23 ENCOUNTER — Ambulatory Visit (INDEPENDENT_AMBULATORY_CARE_PROVIDER_SITE_OTHER): Admitting: Pediatrics

## 2024-07-23 ENCOUNTER — Encounter: Payer: Self-pay | Admitting: Pediatrics

## 2024-07-23 VITALS — BP 165/100 | HR 79 | Temp 97.6°F | Ht 62.0 in | Wt 182.2 lb

## 2024-07-23 DIAGNOSIS — Z1211 Encounter for screening for malignant neoplasm of colon: Secondary | ICD-10-CM | POA: Diagnosis not present

## 2024-07-23 DIAGNOSIS — M546 Pain in thoracic spine: Secondary | ICD-10-CM

## 2024-07-23 DIAGNOSIS — Z0000 Encounter for general adult medical examination without abnormal findings: Secondary | ICD-10-CM

## 2024-07-23 DIAGNOSIS — M545 Low back pain, unspecified: Secondary | ICD-10-CM

## 2024-07-23 DIAGNOSIS — Z1330 Encounter for screening examination for mental health and behavioral disorders, unspecified: Secondary | ICD-10-CM

## 2024-07-23 DIAGNOSIS — G8929 Other chronic pain: Secondary | ICD-10-CM

## 2024-07-23 MED ORDER — BACLOFEN 10 MG PO TABS: 10.0000 mg | ORAL_TABLET | Freq: Three times a day (TID) | ORAL | 0 refills | Status: DC

## 2024-07-23 MED ORDER — CELECOXIB 200 MG PO CAPS: 200.0000 mg | ORAL_CAPSULE | Freq: Two times a day (BID) | ORAL | 0 refills | Status: DC

## 2024-07-23 NOTE — Progress Notes (Signed)
 BP (!) 165/100   Pulse 79   Temp 97.6 F (36.4 C) (Oral)   Ht 5' 2 (1.575 m)   Wt 182 lb 3.2 oz (82.6 kg)   SpO2 97%   BMI 33.32 kg/m    Annual Physical Exam - Female  Subjective:   CC: Annual Exam   Mary Hall is a 58 y.o. female patient here for a preventative health maintenance exam and has no acute complaints.  Health Habits: DIET: in general, a healthy diet   EXERCISE: active but limited by pain DENTAL EXAM: Up to Date EYE EXAM: Up to Date                       Relevant Gynecologic History LMP: No LMP recorded. Patient has had a hysterectomy.  Menstrual Status: postmenopausal, Flow absent PAP History:  No Cervical Cancer Screening results to display.  History abnormal PAP: No  Sexual activity: n/a Family history breast, ovarian cancer: No Domestic Violence Screen, feels safe at home: Yes  family history is not on file. She was adopted.  Social History   Tobacco Use   Smoking status: Every Day    Current packs/day: 1.00    Types: Cigarettes   Smokeless tobacco: Never  Substance Use Topics   Alcohol  use: No   Drug use: No    Comment: HX meth   Social History   Social History Narrative   Not on file    Social drivers questionnaire is reviewed and is positive for : none  Depression Screening:     07/23/2024    8:42 AM 05/27/2024    9:04 AM 03/05/2024    8:15 AM 02/13/2024    8:12 AM 01/14/2024    9:04 AM  Depression screen PHQ 2/9  Decreased Interest 1 1 1 1  0  Down, Depressed, Hopeless 1 1 1 1  0  PHQ - 2 Score 2 2 2 2  0  Altered sleeping 1 1 0 2 0  Tired, decreased energy 1 1 0 2 0  Change in appetite 1 2 0 0 0  Feeling bad or failure about yourself  0 1 0 0 0  Trouble concentrating 1 0 0 0 0  Moving slowly or fidgety/restless 0 0 0 0 0  Suicidal thoughts 0 0 0 0 0  PHQ-9 Score 6 7 2 6  0  Difficult doing work/chores Somewhat difficult Not difficult at all Somewhat difficult Very difficult Not difficult at all        07/23/2024    8:42 AM 05/27/2024    9:04 AM 03/05/2024    8:16 AM 02/13/2024    8:12 AM  GAD 7 : Generalized Anxiety Score  Nervous, Anxious, on Edge 1 1 2 2   Control/stop worrying 1 1 1 1   Worry too much - different things 1 1 1 2   Trouble relaxing 1 1 1 1   Restless 1 0 0 0  Easily annoyed or irritable 0 0 0 0  Afraid - awful might happen 2 0 1 1  Total GAD 7 Score 7 4 6 7   Anxiety Difficulty Somewhat difficult Not difficult at all Somewhat difficult Not difficult at all    Mental Health Plan: CTM  Self Management Goals  Goals   None     Health Maintenance Colon Cancer Screening : Due Mammogram : Due DXA scan : Not applicable Immunizations : up to date and documented  Review of Systems See HPI for relevant ROS.  Outpatient Medications  Prior to Visit  Medication Sig Dispense Refill   losartan  (COZAAR ) 25 MG tablet Take 1 tablet (25 mg total) by mouth daily. 90 tablet 3   omeprazole  (PRILOSEC) 40 MG capsule Take 1 capsule (40 mg total) by mouth 2 (two) times daily before a meal. 60 capsule 3   rosuvastatin  (CRESTOR ) 10 MG tablet Take 1 tablet (10 mg total) by mouth daily. 90 tablet 3   albuterol  (VENTOLIN  HFA) 108 (90 Base) MCG/ACT inhaler Inhale 2 puffs into the lungs every 6 (six) hours as needed for wheezing or shortness of breath. (Patient not taking: Reported on 07/23/2024) 8.5 g 2   hydrOXYzine  (ATARAX ) 10 MG tablet Take 0.5-1 tablets (5-10 mg total) by mouth 3 (three) times daily as needed for anxiety. (Patient not taking: Reported on 07/23/2024) 30 tablet 0   methylPREDNISolone  (MEDROL  DOSEPAK) 4 MG TBPK tablet Follow packaging instructions (Patient not taking: Reported on 07/23/2024) 21 each 0   celecoxib  (CELEBREX ) 200 MG capsule Take 1 capsule (200 mg total) by mouth 2 (two) times daily. (Patient not taking: Reported on 07/23/2024) 60 capsule 0   No facility-administered medications prior to visit.     Patient Active Problem List   Diagnosis Date Noted    Tinnitus of both ears 06/05/2024   Epidermoid cyst 06/05/2024   Reactive airway disease 01/02/2024   Paresthesia of lower extremity 12/04/2023   GAD (generalized anxiety disorder) 12/04/2023   Gastroesophageal reflux disease 11/29/2023   Essential hypertension 04/03/2023   Hyperlipidemia 04/03/2023   Scoliosis of thoracic spine 01/03/2021   Panic attacks 05/02/2020   Chronic thoracic spine pain 10/29/2019   Posttraumatic stress disorder 10/29/2019   Tobacco use disorder 10/29/2019    Objective:   Vitals:   07/23/24 0836 07/23/24 0844  BP: (!) 170/99 (!) 165/100  Pulse: 79 79  Temp: 97.6 F (36.4 C)   Height: 5' 2 (1.575 m)   Weight: 182 lb 3.2 oz (82.6 kg)   SpO2: 97%   TempSrc: Oral   BMI (Calculated): 33.32     Body mass index is 33.32 kg/m.  Physical Exam Constitutional:      Appearance: Normal appearance.  HENT:     Head: Normocephalic and atraumatic.  Eyes:     Pupils: Pupils are equal, round, and reactive to light.  Cardiovascular:     Rate and Rhythm: Normal rate and regular rhythm.     Pulses: Normal pulses.     Heart sounds: Normal heart sounds.  Pulmonary:     Effort: Pulmonary effort is normal.     Breath sounds: Normal breath sounds.  Abdominal:     General: Abdomen is flat.     Palpations: Abdomen is soft.  Musculoskeletal:        General: Normal range of motion.     Cervical back: Normal range of motion.  Skin:    General: Skin is warm and dry.     Capillary Refill: Capillary refill takes less than 2 seconds.  Neurological:     General: No focal deficit present.     Mental Status: She is alert. Mental status is at baseline.  Psychiatric:        Mood and Affect: Mood normal.        Behavior: Behavior normal.     Assessment and Plan:   Annual physical exam Discussed lifestyle modifications and goals including plant based eating styles (such as: Mediterranean eating style), regular exercise (at least 150 min of moderate-intensity aerobic  exercise per week, given  AHA workout handout), get adequate sleep, and continue working with PCP towards meeting health goals to ensure healthy aging.  -     Ambulatory referral to Optometry  Encounter for behavioral health screening As part of their intake evaluation, the patient was screened for depression, anxiety.  PHQ9 SCORE 6, GAD7 SCORE 7. Screening results negative for tested conditions. See plan under problem/diagnosis above.  Screen for colon cancer -     Cologuard  Chronic thoracic spine pain Requesting refills.  -     Baclofen ; Take 1 tablet (10 mg total) by mouth 3 (three) times daily.  Dispense: 30 each; Refill: 0 -     Celecoxib ; Take 1 capsule (200 mg total) by mouth 2 (two) times daily.  Dispense: 60 capsule; Refill: 0   This plan was discussed with the patient and questions were answered. There were no further concerns.  Follow up as indicated, or sooner should any new problems arise, if conditions worsen, or if they are otherwise concerned.   See patient instructions for additional information.  Hadassah SHAUNNA Nett, MD  Family Medicine      Future Appointments  Date Time Provider Department Center  08/25/2024  9:20 AM Nett Hadassah SHAUNNA, MD [facility] [address]

## 2024-07-23 NOTE — Patient Instructions (Signed)
 For hearing test call: [phone] to make an appointment.

## 2024-07-29 ENCOUNTER — Encounter: Payer: Self-pay | Admitting: Pediatrics

## 2024-08-11 DIAGNOSIS — Z1211 Encounter for screening for malignant neoplasm of colon: Secondary | ICD-10-CM | POA: Diagnosis not present

## 2024-08-18 ENCOUNTER — Other Ambulatory Visit: Payer: Self-pay | Admitting: Pediatrics

## 2024-08-18 DIAGNOSIS — K219 Gastro-esophageal reflux disease without esophagitis: Secondary | ICD-10-CM

## 2024-08-20 ENCOUNTER — Other Ambulatory Visit: Payer: Self-pay | Admitting: Pediatrics

## 2024-08-20 DIAGNOSIS — K219 Gastro-esophageal reflux disease without esophagitis: Secondary | ICD-10-CM

## 2024-08-20 NOTE — Telephone Encounter (Signed)
 Requested Prescriptions  Pending Prescriptions Disp Refills   omeprazole  (PRILOSEC) 40 MG capsule [Pharmacy Med Name: OMEPRAZOLE  DR 40 MG CAPSULE] 90 capsule 0    Sig: Take 1 capsule (40 mg total) by mouth daily.     Gastroenterology: Proton Pump Inhibitors Passed - 08/20/2024  4:26 PM      Passed - Valid encounter within last 12 months    Recent Outpatient Visits           4 weeks ago Annual physical exam   [place] [facility] Hadassah SQUIBB, MD   2 months ago Acute midline low back pain without sciatica   [place] [facility] Hadassah SQUIBB, MD   2 months ago BV (bacterial vaginosis)   [place] [facility] Hadassah SQUIBB, MD   5 months ago GAD (generalized anxiety disorder)   [place] [facility] Hadassah SQUIBB, MD   6 months ago Essential hypertension    [facility] Hadassah SQUIBB, MD

## 2024-08-21 LAB — COLOGUARD: COLOGUARD: NEGATIVE

## 2024-08-21 NOTE — Telephone Encounter (Unsigned)
 Copied from CRM #[id number]. Topic: Clinical - Medication Question >> Aug 21, 2024  2:00 PM Amy B wrote: Reason for CRM:  Patient states her omeprazole  was increased to two capsules per day and requests a refill  to reflect that change.  Please send to pharmacy.

## 2024-08-21 NOTE — Telephone Encounter (Signed)
 Requested Prescriptions  Refused Prescriptions Disp Refills   omeprazole  (PRILOSEC) 40 MG capsule [Pharmacy Med Name: OMEPRAZOLE  DR 40 MG CAPSULE] 60 capsule 0    Sig: Take 1 capsule (40 mg total) by mouth 2 (two) times daily before a meal.     Gastroenterology: Proton Pump Inhibitors Passed - 08/21/2024  5:30 PM      Passed - Valid encounter within last 12 months    Recent Outpatient Visits           4 weeks ago Annual physical exam   [place] [facility] Hadassah SQUIBB, MD   2 months ago Acute midline low back pain without sciatica   [place] [facility] Hadassah SQUIBB, MD   2 months ago BV (bacterial vaginosis)   [place] [facility] Hadassah SQUIBB, MD   5 months ago GAD (generalized anxiety disorder)   [place] [facility] Hadassah SQUIBB, MD   6 months ago Essential hypertension   [facility] Hadassah SQUIBB, MD

## 2024-08-21 NOTE — Telephone Encounter (Signed)
 Copied from CRM [id number]. Topic: Clinical - Prescription Issue >> Aug 21, 2024  1:21 PM Mary Hall wrote: Reason for CRM: Pls call patient.. checking status of refill omeprazole  (PRILOSEC) 40 MG capsule

## 2024-08-24 ENCOUNTER — Ambulatory Visit: Payer: Self-pay | Admitting: Family Medicine

## 2024-08-25 ENCOUNTER — Other Ambulatory Visit: Payer: Self-pay | Admitting: Pediatrics

## 2024-08-25 ENCOUNTER — Ambulatory Visit: Admitting: Pediatrics

## 2024-08-25 DIAGNOSIS — K219 Gastro-esophageal reflux disease without esophagitis: Secondary | ICD-10-CM

## 2024-08-25 MED ORDER — OMEPRAZOLE 40 MG PO CPDR: 40.0000 mg | DELAYED_RELEASE_CAPSULE | Freq: Two times a day (BID) | ORAL | 0 refills | Status: DC

## 2024-08-25 NOTE — Progress Notes (Signed)
 Updated omeprazole  prescription to BID dosing.  Hadassah SHAUNNA Nett, MD

## 2024-08-31 ENCOUNTER — Other Ambulatory Visit: Payer: Self-pay | Admitting: Pediatrics

## 2024-08-31 DIAGNOSIS — J4540 Moderate persistent asthma, uncomplicated: Secondary | ICD-10-CM

## 2024-09-03 NOTE — Telephone Encounter (Signed)
 Requested Prescriptions  Pending Prescriptions Disp Refills   albuterol  (VENTOLIN  HFA) 108 (90 Base) MCG/ACT inhaler [Pharmacy Med Name: VENTOLIN  HFA 90 MCG INHALER] 18 g 2    Sig: Inhale 2puffs into the lungs every 6 (six) hours as needed for wheezing or shortness of breath.     Pulmonology:  Beta Agonists 2 Failed - 09/03/2024 12:00 PM      Failed - Last BP in normal range    BP Readings from Last 1 Encounters:  07/23/24 (!) 165/100         Passed - Last Heart Rate in normal range    Pulse Readings from Last 1 Encounters:  07/23/24 79         Passed - Valid encounter within last 12 months    Recent Outpatient Visits           1 month ago Annual physical exam   [place] [facility] Herold Hadassah SQUIBB, MD   3 months ago Acute midline low back pain without sciatica   [place] [facility] Herold Hadassah SQUIBB, MD   3 months ago BV (bacterial vaginosis)   [place] [facility] Herold Hadassah SQUIBB, MD   6 months ago GAD (generalized anxiety disorder)   [place] [facility] Herold Hadassah SQUIBB, MD   6 months ago Essential hypertension   [place] [facility] Herold Hadassah SQUIBB, MD

## 2024-09-08 ENCOUNTER — Ambulatory Visit: Admitting: Pediatrics

## 2024-09-11 ENCOUNTER — Encounter: Payer: Self-pay | Admitting: Pediatrics

## 2024-09-11 ENCOUNTER — Ambulatory Visit: Admitting: Pediatrics

## 2024-09-11 VITALS — BP 143/89 | HR 77 | Temp 98.1°F | Ht 62.01 in | Wt 180.4 lb

## 2024-09-11 DIAGNOSIS — I10 Essential (primary) hypertension: Secondary | ICD-10-CM

## 2024-09-11 DIAGNOSIS — Z6832 Body mass index (BMI) 32.0-32.9, adult: Secondary | ICD-10-CM | POA: Diagnosis not present

## 2024-09-11 DIAGNOSIS — F411 Generalized anxiety disorder: Secondary | ICD-10-CM | POA: Diagnosis not present

## 2024-09-11 DIAGNOSIS — K219 Gastro-esophageal reflux disease without esophagitis: Secondary | ICD-10-CM

## 2024-09-11 DIAGNOSIS — J4540 Moderate persistent asthma, uncomplicated: Secondary | ICD-10-CM | POA: Diagnosis not present

## 2024-09-11 DIAGNOSIS — M546 Pain in thoracic spine: Secondary | ICD-10-CM | POA: Diagnosis not present

## 2024-09-11 DIAGNOSIS — R635 Abnormal weight gain: Secondary | ICD-10-CM

## 2024-09-11 DIAGNOSIS — G8929 Other chronic pain: Secondary | ICD-10-CM

## 2024-09-11 MED ORDER — MELOXICAM 15 MG PO TABS: 15.0000 mg | ORAL_TABLET | Freq: Every day | ORAL | 2 refills | Status: AC

## 2024-09-11 MED ORDER — TOPIRAMATE 25 MG PO TABS: 25.0000 mg | ORAL_TABLET | Freq: Two times a day (BID) | ORAL | 1 refills | Status: AC

## 2024-09-11 MED ORDER — CYCLOBENZAPRINE HCL 5 MG PO TABS: 5.0000 mg | ORAL_TABLET | Freq: Three times a day (TID) | ORAL | 2 refills | Status: AC | PRN

## 2024-09-11 MED ORDER — LOSARTAN POTASSIUM 50 MG PO TABS: 50.0000 mg | ORAL_TABLET | Freq: Every day | ORAL | 1 refills | Status: AC

## 2024-09-11 MED ORDER — OMEPRAZOLE 40 MG PO CPDR: 40.0000 mg | DELAYED_RELEASE_CAPSULE | Freq: Two times a day (BID) | ORAL | 1 refills | Status: AC

## 2024-09-11 MED ORDER — ALBUTEROL SULFATE HFA 108 (90 BASE) MCG/ACT IN AERS: 2.0000 | INHALATION_SPRAY | Freq: Four times a day (QID) | RESPIRATORY_TRACT | 6 refills | Status: AC | PRN

## 2024-09-11 NOTE — Patient Instructions (Signed)
 Take topiramate once a day for the first 7 days, then you can double up on it

## 2024-09-11 NOTE — Progress Notes (Unsigned)
 Office Visit  BP (!) 143/89 (BP Location: Right Arm, Cuff Size: Normal)   Pulse 77   Temp 98.1 F (36.7 C) (Oral)   Ht 5' 2.01 (1.575 m)   Wt 180 lb 6.4 oz (81.8 kg)   SpO2 97%   BMI 32.99 kg/m    Subjective:    Patient ID: Mary Hall, female    DOB: 04/06/1966, 58 y.o.   MRN: [id number]  HPI: Mary Hall is a 58 y.o. female  Chief Complaint  Patient presents with   Anxiety   Depression    4 week F/u.    Discussed the use of AI scribe software for clinical note transcription with the patient, who gave verbal consent to proceed.  History of Present Illness   Mary Hall is a 58 year old female with hypertension who presents for blood pressure management and medication review.  She experiences fluctuations in her blood pressure, with elevated levels noted in the mornings. Home monitoring shows some readings reach the 140s/90s. She is currently on losartan .  She has an issue with her Prilosec prescription, which is written for once daily use, but she takes it twice daily due to worsening symptoms in the afternoon. Attempts to take it once daily result in significant discomfort.  Her anxiety has been heightened recently due to environmental changes, such as the removal of a tree near her residence, which affected her bird-watching activities. Bird-watching is a relaxing activity for her, and she has been upset by the loss of the tree.  She has chronic back pain that remains unchanged. She is currently taking Celebrex  and baclofen , which provide minimal relief. She recalls trying meloxicam  in the past but is unsure of its effectiveness. She is open to trying different medications to manage her back pain.  She is struggling with weight loss despite dietary changes, such as eliminating sodas and reducing sugar intake. She reports a stable weight of 180 pounds and feels that her metabolism may be slow. She has been increasing her physical activity by walking more  but has not seen any weight loss. She describes herself as a 'grazer,' eating small amounts throughout the day, primarily at night, despite keeping healthier snacks available.  Her current medications include losartan , Prilosec, Celebrex , baclofen , and hydroxyzine  as needed. She has two bottles of hydroxyzine  remaining and does not require a refill at this time.        Relevant past medical, surgical, family and social history reviewed and updated as indicated. Interim medical history since our last visit reviewed. Allergies and medications reviewed and updated.  ROS per HPI unless specifically indicated above     Objective:    BP (!) 143/89 (BP Location: Right Arm, Cuff Size: Normal)   Pulse 77   Temp 98.1 F (36.7 C) (Oral)   Ht 5' 2.01 (1.575 m)   Wt 180 lb 6.4 oz (81.8 kg)   SpO2 97%   BMI 32.99 kg/m   Wt Readings from Last 3 Encounters:  09/11/24 180 lb 6.4 oz (81.8 kg)  07/23/24 182 lb 3.2 oz (82.6 kg)  06/05/24 184 lb 8 oz (83.7 kg)     Physical Exam      09/11/2024    2:40 PM 07/23/2024    8:42 AM 05/27/2024    9:04 AM 03/05/2024    8:15 AM 02/13/2024    8:12 AM  Depression screen PHQ 2/9  Decreased Interest 2 1 1 1 1   Down, Depressed, Hopeless 1 1 1 1  1  PHQ - 2 Score 3 2 2 2 2   Altered sleeping 1 1 1  0 2  Tired, decreased energy 1 1 1  0 2  Change in appetite 1 1 2  0 0  Feeling bad or failure about yourself  0 0 1 0 0  Trouble concentrating 1 1 0 0 0  Moving slowly or fidgety/restless 0 0 0 0 0  Suicidal thoughts 0 0 0 0 0  PHQ-9 Score 7 6  7  2  6    Difficult doing work/chores Somewhat difficult Somewhat difficult Not difficult at all Somewhat difficult Very difficult     Data saved with a previous flowsheet row definition       09/11/2024    2:40 PM 07/23/2024    8:42 AM 05/27/2024    9:04 AM 03/05/2024    8:16 AM  GAD 7 : Generalized Anxiety Score  Nervous, Anxious, on Edge 2 1 1 2   Control/stop worrying 0 1 1 1   Worry too much - different things  1 1 1 1   Trouble relaxing 2 1 1 1   Restless 0 1 0 0  Easily annoyed or irritable 0 0 0 0  Afraid - awful might happen 1 2 0 1  Total GAD 7 Score 6 7 4 6   Anxiety Difficulty Somewhat difficult Somewhat difficult Not difficult at all Somewhat difficult       Assessment & Plan:  Assessment & Plan   Weight gain -     Hemoglobin A1c -     Lipid panel -     Basic metabolic panel with GFR -     TSH  Moderate persistent reactive airway disease without complication -     Albuterol  Sulfate HFA; Inhale 2 puffs into the lungs every 6 (six) hours as needed for wheezing or shortness of breath.  Dispense: 18 g; Refill: 6  Essential hypertension -     Losartan  Potassium; Take 1 tablet (50 mg total) by mouth daily.  Dispense: 90 tablet; Refill: 1  Gastroesophageal reflux disease without esophagitis -     Omeprazole ; Take 1 capsule (40 mg total) by mouth in the morning and at bedtime.  Dispense: 180 capsule; Refill: 1  Chronic thoracic spine pain -     Meloxicam ; Take 1 tablet (15 mg total) by mouth daily.  Dispense: 30 tablet; Refill: 2 -     Cyclobenzaprine  HCl; Take 1 tablet (5 mg total) by mouth 3 (three) times daily as needed for muscle spasms.  Dispense: 30 tablet; Refill: 2  BMI 32.0-32.9,adult  Other orders -     Topiramate ; Take 1 tablet (25 mg total) by mouth 2 (two) times daily.  Dispense: 60 tablet; Refill: 1     Assessment and Plan    Essential hypertension Blood pressure occasionally elevated, particularly in the morning. Current losartan  may need adjustment. - Increased losartan  to 50 mg daily. - Monitor blood pressure at home twice weekly, especially post-dosage adjustment.  Gastroesophageal reflux disease Prilosec needed twice daily for effective symptom management. - Corrected Prilosec to twice daily. - Ensured pharmacy dispenses as prescribed.  Chronic thoracic spine pain Persistent pain with minimal relief from current medications. Previous treatments'  effectiveness uncertain. - Discontinued Celebrex  and baclofen . - Initiated meloxicam  and Flexeril . - Discussed potential benefit of physical therapy.  Obesity Difficulty losing weight despite lifestyle changes. Nighttime snacking noted. Previous thyroid tests normal. Discussed topiramate  for weight management. - Ordered repeat labs for metabolic status. - Initiated topiramate  25 mg daily, increase to  twice daily if tolerated. - Continue current diet and exercise. - Consider metformin if topiramate  ineffective.         Follow up plan: Return in about 3 months (around 12/10/2024) for wt management.  Hadassah SHAUNNA Nett, MD

## 2024-09-12 LAB — BASIC METABOLIC PANEL WITH GFR
BUN/Creatinine Ratio: 15 (ref 9–23)
BUN: 10 mg/dL (ref 6–24)
CO2: 22 mmol/L (ref 20–29)
Calcium: 9.6 mg/dL (ref 8.7–10.2)
Chloride: 102 mmol/L (ref 96–106)
Creatinine, Ser: 0.67 mg/dL (ref 0.57–1.00)
Glucose: 85 mg/dL (ref 70–99)
Potassium: 3.8 mmol/L (ref 3.5–5.2)
Sodium: 140 mmol/L (ref 134–144)
eGFR: 103 mL/min/1.73 (ref >59)

## 2024-09-12 LAB — LIPID PANEL
Chol/HDL Ratio: 2.7 ratio (ref 0.0–4.4)
Cholesterol, Total: 168 mg/dL (ref 100–199)
HDL: 62 mg/dL (ref >39)
LDL Chol Calc (NIH): 90 mg/dL (ref 0–99)
Triglycerides: 85 mg/dL (ref 0–149)
VLDL Cholesterol Cal: 16 mg/dL (ref 5–40)

## 2024-09-12 LAB — HEMOGLOBIN A1C
Est. average glucose Bld gHb Est-mCnc: 117 mg/dL
Hgb A1c MFr Bld: 5.7 % — ABNORMAL HIGH (ref 4.8–5.6)

## 2024-09-12 LAB — TSH: TSH: 1.46 u[IU]/mL (ref 0.450–4.500)

## 2024-09-15 ENCOUNTER — Ambulatory Visit: Payer: Self-pay | Admitting: Pediatrics

## 2024-09-18 ENCOUNTER — Encounter: Payer: Self-pay | Admitting: Pediatrics

## 2024-09-18 DIAGNOSIS — Z6832 Body mass index (BMI) 32.0-32.9, adult: Secondary | ICD-10-CM | POA: Insufficient documentation

## 2024-09-18 NOTE — Assessment & Plan Note (Signed)
 Prilosec needed twice daily for effective symptom management. - Corrected Prilosec to twice daily. - Ensured pharmacy dispenses as prescribed.

## 2024-09-18 NOTE — Assessment & Plan Note (Signed)
 Refills requested.

## 2024-09-18 NOTE — Assessment & Plan Note (Signed)
 Blood pressure occasionally elevated, particularly in the morning. Current losartan  may need adjustment. - Increased losartan  to 50 mg daily. - Monitor blood pressure at home twice weekly, especially post-dosage adjustment.

## 2024-09-18 NOTE — Assessment & Plan Note (Addendum)
 Well controlled on duloxetine  20MG  daily. CTM.

## 2024-09-18 NOTE — Assessment & Plan Note (Signed)
 Persistent pain with minimal relief from current medications. Previous treatments' effectiveness uncertain. - Discontinued Celebrex  and baclofen . - Initiated meloxicam  and Flexeril . - Discussed potential benefit of physical therapy.

## 2024-09-18 NOTE — Assessment & Plan Note (Signed)
 Difficulty losing weight despite lifestyle changes. Nighttime snacking noted. Previous thyroid tests normal. Discussed topiramate  for weight management. - Ordered repeat labs for metabolic status. - Initiated topiramate  25 mg daily, increase to twice daily if tolerated. - Continue current diet and exercise. - Consider metformin if topiramate  ineffective.

## 2024-10-12 ENCOUNTER — Other Ambulatory Visit: Payer: Self-pay | Admitting: Pediatrics

## 2024-10-12 DIAGNOSIS — K219 Gastro-esophageal reflux disease without esophagitis: Secondary | ICD-10-CM

## 2024-10-12 DIAGNOSIS — F411 Generalized anxiety disorder: Secondary | ICD-10-CM

## 2024-10-12 DIAGNOSIS — I10 Essential (primary) hypertension: Secondary | ICD-10-CM

## 2024-10-12 NOTE — Telephone Encounter (Signed)
 Copied from CRM #[id number]. Topic: Clinical - Medication Refill >> Oct 12, 2024 11:43 AM Amy B wrote: Medication:  omeprazole  (PRILOSEC) 40 MG capsule DULoxetine  (CYMBALTA ) 20 MG capsule losartan  (COZAAR ) 50 MG tablet  Has the patient contacted their pharmacy? Yes (Agent: If no, request that the patient contact the pharmacy for the refill. If patient does not wish to contact the pharmacy document the reason why and proceed with request.) (Agent: If yes, when and what did the pharmacy advise?)  This is the patient's preferred pharmacy:  [facility] - [place], [place] - [address] [place] [address] Phone: [phone] Fax: [phone]  Is this the correct pharmacy for this prescription? Yes If no, delete pharmacy and type the correct one.   Has the prescription been filled recently? No  Is the patient out of the medication? Yes  Has the patient been seen for an appointment in the last year OR does the patient have an upcoming appointment? Yes  Can we respond through MyChart? Yes  Agent: Please be advised that Rx refills may take up to 3 business days. We ask that you follow-up with your pharmacy.

## 2024-10-13 NOTE — Telephone Encounter (Signed)
 Requested medication (s) are due for refill today:   Requested medication (s) are on the active medication list: Yes  Last refill:  09/11/24  Future visit scheduled: Yes  Notes to clinic:  Duloxetine  - historical provider.     Requested Prescriptions  Pending Prescriptions Disp Refills   omeprazole  (PRILOSEC) 40 MG capsule 180 capsule 1    Sig: Take 1 capsule (40 mg total) by mouth in the morning and at bedtime.     Gastroenterology: Proton Pump Inhibitors Passed - 10/13/2024  1:32 PM      Passed - Valid encounter within last 12 months    Recent Outpatient Visits           1 month ago Essential hypertension   [place] [facility] Herold Hadassah SQUIBB, MD   2 months ago Annual physical exam   [place] [facility] Herold Hadassah SQUIBB, MD   4 months ago Acute midline low back pain without sciatica   [place] [facility] Herold Hadassah SQUIBB, MD   4 months ago BV (bacterial vaginosis)   [place] [facility] Herold Hadassah SQUIBB, MD   7 months ago GAD (generalized anxiety disorder)   [place] [facility] Herold Hadassah SQUIBB, MD               DULoxetine  (CYMBALTA ) 20 MG capsule      Sig: Take 1 capsule (20 mg total) by mouth daily.     Psychiatry: Antidepressants - SNRI - duloxetine  Failed - 10/13/2024  1:32 PM      Failed - Last BP in normal range    BP Readings from Last 1 Encounters:  09/11/24 (!) 143/89         Passed - Cr in normal range and within 360 days    Creatinine  Date Value Ref Range Status  02/13/2012 0.55 (L) 0.60 - 1.30 mg/dL Final   Creatinine, Ser  Date Value Ref Range Status  09/11/2024 0.67 0.57 - 1.00 mg/dL Final         Passed - eGFR is 30 or above and within 360 days    EGFR (African American)  Date Value Ref Range Status  02/13/2012 >60  Final   GFR calc Af Amer  Date Value Ref Range Status  11/15/2019 >60 >60 mL/min Final   EGFR (Non-African Amer.)  Date Value Ref Range  Status  02/13/2012 >60  Final    Comment:    eGFR values <27mL/min/1.73 m2 may be an indication of chronic kidney disease (CKD). Calculated eGFR is useful in patients with stable renal function. The eGFR calculation will not be reliable in acutely ill patients when serum creatinine is changing rapidly. It is not useful in  patients on dialysis. The eGFR calculation may not be applicable to patients at the low and high extremes of body sizes, pregnant women, and vegetarians.    GFR calc non Af Amer  Date Value Ref Range Status  11/15/2019 >60 >60 mL/min Final   eGFR  Date Value Ref Range Status  09/11/2024 103 >59 mL/min/1.73 Final         Passed - Completed PHQ-2 or PHQ-9 in the last 360 days      Passed - Valid encounter within last 6 months    Recent Outpatient Visits           1 month ago Essential hypertension   [place] [facility] Herold Hadassah SQUIBB, MD   2 months  ago Annual physical exam   [place] [facility] Herold Hadassah SQUIBB, MD   4 months ago Acute midline low back pain without sciatica   [place] [facility] Herold Hadassah SQUIBB, MD   4 months ago BV (bacterial vaginosis)   [place] [facility] Herold Hadassah SQUIBB, MD   7 months ago GAD (generalized anxiety disorder)   [place] [facility] Herold Hadassah SQUIBB, MD               losartan  (COZAAR ) 50 MG tablet 90 tablet 1    Sig: Take 1 tablet (50 mg total) by mouth daily.     Cardiovascular:  Angiotensin Receptor Blockers Failed - 10/13/2024  1:32 PM      Failed - Last BP in normal range    BP Readings from Last 1 Encounters:  09/11/24 (!) 143/89         Passed - Cr in normal range and within 180 days    Creatinine  Date Value Ref Range Status  02/13/2012 0.55 (L) 0.60 - 1.30 mg/dL Final   Creatinine, Ser  Date Value Ref Range Status  09/11/2024 0.67 0.57 - 1.00 mg/dL Final         Passed - K in normal range and within 180 days     Potassium  Date Value Ref Range Status  09/11/2024 3.8 3.5 - 5.2 mmol/L Final  02/13/2012 3.7 3.5 - 5.1 mmol/L Final         Passed - Patient is not pregnant      Passed - Valid encounter within last 6 months    Recent Outpatient Visits           1 month ago Essential hypertension   [place] [facility] Herold Hadassah SQUIBB, MD   2 months ago Annual physical exam   [place] [facility] Herold Hadassah SQUIBB, MD   4 months ago Acute midline low back pain without sciatica   [place] [facility] Herold Hadassah SQUIBB, MD   4 months ago BV (bacterial vaginosis)   [place] [facility] Herold Hadassah SQUIBB, MD   7 months ago GAD (generalized anxiety disorder)   [place] [facility] Herold Hadassah SQUIBB, MD

## 2024-10-13 NOTE — Telephone Encounter (Signed)
 TOC appointment scheduled for 12/11/24.

## 2024-10-18 NOTE — Telephone Encounter (Signed)
 This never seems to have been written by Dr. Herold- has she been on it?

## 2024-10-19 MED ORDER — DULOXETINE HCL 20 MG PO CPEP: 20.0000 mg | ORAL_CAPSULE | Freq: Every day | ORAL | 0 refills | Status: AC

## 2024-10-19 NOTE — Telephone Encounter (Signed)
 Called and spoke with patient. She states she has been on the Cymbalta . States she has been taking it every night at bedtime. States she take 1 of the 20 mg tables at bedtime and it really helps her. Would like this sent to [facility] please.

## 2024-12-11 ENCOUNTER — Encounter: Admitting: Family Medicine
